# Patient Record
Sex: Female | Born: 1955 | Race: White | Hispanic: No | Marital: Married | Smoking: Never smoker
Health system: Southern US, Community
[De-identification: ages and names within clinical notes are randomized; demographics above are authoritative.]

## PROBLEM LIST (undated history)

## (undated) DIAGNOSIS — G43909 Migraine, unspecified, not intractable, without status migrainosus: Secondary | ICD-10-CM

## (undated) HISTORY — PX: EYE SURGERY: SHX253

## (undated) HISTORY — DX: Migraine, unspecified, not intractable, without status migrainosus: G43.909

---

## 1988-03-20 HISTORY — PX: BREAST BIOPSY: SHX20

## 1997-07-27 ENCOUNTER — Other Ambulatory Visit: Admission: RE | Admit: 1997-07-27 | Discharge: 1997-07-27 | Payer: Self-pay | Admitting: Gynecology

## 1999-02-15 ENCOUNTER — Other Ambulatory Visit: Admission: RE | Admit: 1999-02-15 | Discharge: 1999-02-15 | Payer: Self-pay | Admitting: Gynecology

## 2000-09-27 ENCOUNTER — Other Ambulatory Visit: Admission: RE | Admit: 2000-09-27 | Discharge: 2000-09-27 | Payer: Self-pay | Admitting: Gynecology

## 2000-12-31 ENCOUNTER — Encounter: Payer: Self-pay | Admitting: Internal Medicine

## 2000-12-31 ENCOUNTER — Ambulatory Visit (HOSPITAL_COMMUNITY): Admission: RE | Admit: 2000-12-31 | Discharge: 2000-12-31 | Payer: Self-pay | Admitting: Internal Medicine

## 2002-02-11 ENCOUNTER — Other Ambulatory Visit: Admission: RE | Admit: 2002-02-11 | Discharge: 2002-02-11 | Payer: Self-pay | Admitting: Gynecology

## 2003-02-02 ENCOUNTER — Other Ambulatory Visit: Admission: RE | Admit: 2003-02-02 | Discharge: 2003-02-02 | Payer: Self-pay | Admitting: Gynecology

## 2004-02-09 ENCOUNTER — Other Ambulatory Visit: Admission: RE | Admit: 2004-02-09 | Discharge: 2004-02-09 | Payer: Self-pay | Admitting: Gynecology

## 2005-01-13 ENCOUNTER — Other Ambulatory Visit: Admission: RE | Admit: 2005-01-13 | Discharge: 2005-01-13 | Payer: Self-pay | Admitting: Gynecology

## 2020-08-17 DIAGNOSIS — H2511 Age-related nuclear cataract, right eye: Secondary | ICD-10-CM | POA: Diagnosis not present

## 2020-08-25 DIAGNOSIS — H52201 Unspecified astigmatism, right eye: Secondary | ICD-10-CM | POA: Diagnosis not present

## 2020-08-25 DIAGNOSIS — H2511 Age-related nuclear cataract, right eye: Secondary | ICD-10-CM | POA: Diagnosis not present

## 2020-09-15 DIAGNOSIS — H2512 Age-related nuclear cataract, left eye: Secondary | ICD-10-CM | POA: Diagnosis not present

## 2020-12-17 ENCOUNTER — Telehealth: Payer: Self-pay

## 2020-12-17 NOTE — Telephone Encounter (Signed)
Hepatitis C was returned as non-reactive resulted through [facility].

## 2021-01-06 NOTE — Progress Notes (Signed)
NEW PATIENT- ESTABLISH PATIENT CARE  Assessment and Plan:  Jacqueline Carson was seen today for establish care.  Diagnoses and all orders for this visit:  Encounter for general adult medical examination with abnormal findings Due annually  Screening for diabetes mellitus/abnormal glucose -     Hemoglobin A1c  Screening, anemia, deficiency, iron -     CBC with Differential/Platelet  Screening for thyroid disorder -     TSH  Screening for hematuria or proteinuria -     Urinalysis, Routine w reflex microscopic -     Microalbumin / creatinine urine ratio  Screening for lipid disorders/elevated cholesterol -     Lipid panel  Screening for ischemic heart disease -     EKG 12-Lead  Vitamin D deficiency -     VITAMIN D 25 Hydroxy (Vit-D Deficiency, Fractures)  Screening for endocrine, metabolic and immunity disorder -     COMPLETE METABOLIC PANEL WITH GFR -     Magnesium  Screening for colon cancer -     Cologuard  Screening mammogram, encounter for -     MM DIAG BREAST TOMO BILATERAL; Future     Continue diet and meds as discussed. Further disposition pending results of labs. Discussed med's effects and SE's.   Over 30 minutes of exam, counseling, chart review, and critical decision making was performed.   No future appointments.   ----------------------------------------------------------------------------------------------------------------------  HPI 65 y.o. female  presents for New patient complete physical  Pt has not seen a physician in 18 years. She has a lot of anxiety but does do behavioral modifications- deep breathing to help relax. Has not had mammogram or Pap smear until 2000. Had colonoscopy over 15 years ago   She does have a lot of headaches and has a history of migraines but states daily headaches are mild and resolve once she takes her fish oil. Denies visual changes, nausea, sensitivity to noise and light.   She does take Claritin for seasonal  allergies and it does control symptoms  BMI is Body mass index is 25.3 kg/m., she has been working on diet and exercise. Wt Readings from Last 3 Encounters:  01/11/21 153 lb 3.2 oz (69.5 kg)    Her blood pressure has been controlled at home BP's running 120-130's/ 80-low 90's, today their BP is BP: 140/90 very nervous to have visit today.  She does workout. She denies chest pain, shortness of breath, dizziness.   She is not on cholesterol medication The cholesterol last visit was:  No results found for: CHOL, HDL, LDLCALC, LDLDIRECT, TRIG, CHOLHDL  Last A1C in the office was: No results found for: HGBA1C  Patient is on Vitamin D supplement 2000 units daily   No results found for: VD25OH      Current Medications:  Current Outpatient Medications on File Prior to Visit  Medication Sig   ascorbic acid (QC VITAMIN C WITH ROSE HIPS) 500 MG tablet Take 500 mg by mouth daily.   ASHWAGANDHA PO Take 800 mg by mouth.   Calcium Carbonate (CALCIUM 600 PO) Take by mouth.   Cholecalciferol (D3) 50 MCG (2000 UT) TABS Take by mouth.   Chromium-Cinnamon (CINNAMON PLUS CHROMIUM) [phone] MCG-MG CAPS Take 1,000 mg by mouth.   Cranberry-Vitamin C-Vitamin E (CRANBERRY PLUS VITAMIN C PO) Take by mouth. Cranberry 200mg    Vitamin C 500mg    loratadine (CLARITIN) 10 MG tablet Take 10 mg by mouth daily.   Magnesium 250 MG TABS Take by mouth.   Misc Natural Products (GLUCOSAMINE CHOND  CMP TRIPLE PO) Take by mouth.   Omega-3 Fatty Acids (FISH OIL) 600 MG CAPS Take 600 mg by mouth. EPA 400 DHA   Potassium 99 MG TABS Take by mouth.   TURMERIC CURCUMIN PO Take 500 mg by mouth.   No current facility-administered medications on file prior to visit.     Allergies: Not on File   Medical History:  Past Medical History:  Diagnosis Date   Migraines    Family history- Reviewed and unchanged Social history- Reviewed and unchanged  Immunizations: [employer] 06/12/19, 07/04/19, 02/10/20 Does not get flu  vaccine Hepatits C negative- done 12/06/20    Review of Systems:  Review of Systems  Constitutional:  Negative for chills, fever and malaise/fatigue.  HENT:  Positive for ear pain. Negative for congestion, nosebleeds, sinus pain, sore throat and tinnitus.   Eyes:  Negative for blurred vision and double vision.  Respiratory:  Negative for cough, hemoptysis, sputum production, shortness of breath and wheezing.   Cardiovascular:  Negative for chest pain, palpitations and leg swelling.  Gastrointestinal:  Negative for abdominal pain, constipation, diarrhea, heartburn, nausea and vomiting.  Genitourinary:  Negative for dysuria and frequency.  Musculoskeletal:  Positive for back pain. Negative for joint pain.       Left leg cramps  Skin:  Negative for rash.       2 moles on back  Neurological:  Positive for headaches. Negative for tingling, tremors, sensory change and weakness.  Psychiatric/Behavioral:  Positive for depression. The patient is nervous/anxious. The patient does not have insomnia.      Physical Exam: BP 140/90   Pulse 82   Temp (!) 97.5 F (36.4 C)   Ht 5' 5.25" (1.657 m)   Wt 153 lb 3.2 oz (69.5 kg)   SpO2 96%   BMI 25.30 kg/m  Wt Readings from Last 3 Encounters:  01/11/21 153 lb 3.2 oz (69.5 kg)   General Appearance: Well nourished, in no apparent distress. Eyes: PERRLA, EOMs, conjunctiva no swelling or erythema Sinuses: No Frontal/maxillary tenderness ENT/Mouth: Ext aud canals clear, TMs without erythema, bulging. No erythema, swelling, or exudate on post pharynx.  Tonsils not swollen or erythematous. Hearing normal.  Neck: Supple, thyroid normal.  Respiratory: Respiratory effort normal, BS equal bilaterally without rales, rhonchi, wheezing or stridor.  Cardio: RRR with no MRGs. Brisk peripheral pulses without edema.  Abdomen: Soft, + BS.  Non tender, no guarding, rebound, hernias, masses. Lymphatics: Non tender without lymphadenopathy.  Musculoskeletal: Full  ROM, 5/5 strength, Normal gait Skin: Warm, dry without rashes, lesions, ecchymosis.  Neuro: Cranial nerves intact. No cerebellar symptoms.  Psych: Awake and oriented X 3, normal affect, Insight and Judgment appropriate.    [name], NP 11:06 AM [facility]

## 2021-01-11 ENCOUNTER — Other Ambulatory Visit: Payer: Self-pay

## 2021-01-11 ENCOUNTER — Encounter: Payer: Self-pay | Admitting: Nurse Practitioner

## 2021-01-11 ENCOUNTER — Ambulatory Visit (INDEPENDENT_AMBULATORY_CARE_PROVIDER_SITE_OTHER): Payer: Medicare Other | Admitting: Nurse Practitioner

## 2021-01-11 VITALS — BP 140/90 | HR 82 | Temp 97.5°F | Ht 65.25 in | Wt 153.2 lb

## 2021-01-11 DIAGNOSIS — Z136 Encounter for screening for cardiovascular disorders: Secondary | ICD-10-CM

## 2021-01-11 DIAGNOSIS — E559 Vitamin D deficiency, unspecified: Secondary | ICD-10-CM | POA: Diagnosis not present

## 2021-01-11 DIAGNOSIS — Z131 Encounter for screening for diabetes mellitus: Secondary | ICD-10-CM

## 2021-01-11 DIAGNOSIS — E7800 Pure hypercholesterolemia, unspecified: Secondary | ICD-10-CM | POA: Diagnosis not present

## 2021-01-11 DIAGNOSIS — Z1329 Encounter for screening for other suspected endocrine disorder: Secondary | ICD-10-CM | POA: Diagnosis not present

## 2021-01-11 DIAGNOSIS — I10 Essential (primary) hypertension: Secondary | ICD-10-CM

## 2021-01-11 DIAGNOSIS — Z0000 Encounter for general adult medical examination without abnormal findings: Secondary | ICD-10-CM

## 2021-01-11 DIAGNOSIS — J302 Other seasonal allergic rhinitis: Secondary | ICD-10-CM

## 2021-01-11 DIAGNOSIS — R7309 Other abnormal glucose: Secondary | ICD-10-CM

## 2021-01-11 DIAGNOSIS — Z130 Encounter for screening for diseases of the blood and blood-forming organs and certain disorders involving the immune mechanism: Secondary | ICD-10-CM

## 2021-01-11 DIAGNOSIS — Z1389 Encounter for screening for other disorder: Secondary | ICD-10-CM

## 2021-01-11 DIAGNOSIS — Z0001 Encounter for general adult medical examination with abnormal findings: Secondary | ICD-10-CM

## 2021-01-11 DIAGNOSIS — Z1211 Encounter for screening for malignant neoplasm of colon: Secondary | ICD-10-CM

## 2021-01-11 DIAGNOSIS — Z13220 Encounter for screening for lipoid disorders: Secondary | ICD-10-CM

## 2021-01-11 DIAGNOSIS — Z1231 Encounter for screening mammogram for malignant neoplasm of breast: Secondary | ICD-10-CM

## 2021-01-11 NOTE — Patient Instructions (Signed)

## 2021-01-12 LAB — CBC WITH DIFFERENTIAL/PLATELET
Absolute Monocytes: 370 cells/uL (ref 200–950)
Basophils Absolute: 38 cells/uL (ref 0–200)
Basophils Relative: 0.8 %
Eosinophils Absolute: 29 cells/uL (ref 15–500)
Eosinophils Relative: 0.6 %
HCT: 42.8 % (ref 35.0–45.0)
Hemoglobin: 14.4 g/dL (ref 11.7–15.5)
Lymphs Abs: 1819 cells/uL (ref 850–3900)
MCH: 29.2 pg (ref 27.0–33.0)
MCHC: 33.6 g/dL (ref 32.0–36.0)
MCV: 86.8 fL (ref 80.0–100.0)
MPV: 9.2 fL (ref 7.5–12.5)
Monocytes Relative: 7.7 %
Neutro Abs: 2544 cells/uL (ref 1500–7800)
Neutrophils Relative %: 53 %
Platelets: 378 10*3/uL (ref 140–400)
RBC: 4.93 10*6/uL (ref 3.80–5.10)
RDW: 13 % (ref 11.0–15.0)
Total Lymphocyte: 37.9 %
WBC: 4.8 10*3/uL (ref 3.8–10.8)

## 2021-01-12 LAB — URINALYSIS, ROUTINE W REFLEX MICROSCOPIC
Bacteria, UA: NONE SEEN /HPF
Bilirubin Urine: NEGATIVE
Glucose, UA: NEGATIVE
Hgb urine dipstick: NEGATIVE
Hyaline Cast: NONE SEEN /LPF
Ketones, ur: NEGATIVE
Nitrite: NEGATIVE
Protein, ur: NEGATIVE
RBC / HPF: NONE SEEN /HPF (ref 0–2)
Specific Gravity, Urine: 1.008 (ref 1.001–1.035)
Squamous Epithelial / HPF: NONE SEEN /HPF (ref <=5)
WBC, UA: NONE SEEN /HPF (ref 0–5)
pH: 7 (ref 5.0–8.0)

## 2021-01-12 LAB — MICROALBUMIN / CREATININE URINE RATIO
Creatinine, Urine: 42 mg/dL (ref 20–275)
Microalb, Ur: <0.2 mg/dL

## 2021-01-12 LAB — COMPLETE METABOLIC PANEL WITH GFR
AG Ratio: 1.5 (calc) (ref 1.0–2.5)
ALT: 12 U/L (ref 6–29)
AST: 13 U/L (ref 10–35)
Albumin: 4.4 g/dL (ref 3.6–5.1)
Alkaline phosphatase (APISO): 57 U/L (ref 37–153)
BUN: 11 mg/dL (ref 7–25)
CO2: 27 mmol/L (ref 20–32)
Calcium: 9.7 mg/dL (ref 8.6–10.4)
Chloride: 104 mmol/L (ref 98–110)
Creat: 0.69 mg/dL (ref 0.50–1.05)
Globulin: 2.9 g/dL (calc) (ref 1.9–3.7)
Glucose, Bld: 101 mg/dL — ABNORMAL HIGH (ref 65–99)
Potassium: 4.8 mmol/L (ref 3.5–5.3)
Sodium: 139 mmol/L (ref 135–146)
Total Bilirubin: 0.5 mg/dL (ref 0.2–1.2)
Total Protein: 7.3 g/dL (ref 6.1–8.1)
eGFR: 96 mL/min/{1.73_m2} (ref >=60)

## 2021-01-12 LAB — LIPID PANEL
Cholesterol: 259 mg/dL — ABNORMAL HIGH (ref <200)
HDL: 76 mg/dL (ref >=50)
LDL Cholesterol (Calc): 157 mg/dL (calc) — ABNORMAL HIGH
Non-HDL Cholesterol (Calc): 183 mg/dL (calc) — ABNORMAL HIGH (ref <130)
Total CHOL/HDL Ratio: 3.4 (calc) (ref <5.0)
Triglycerides: 139 mg/dL (ref <150)

## 2021-01-12 LAB — HEMOGLOBIN A1C
Hgb A1c MFr Bld: 6 % of total Hgb — ABNORMAL HIGH (ref <5.7)
Mean Plasma Glucose: 126 mg/dL
eAG (mmol/L): 7 mmol/L

## 2021-01-12 LAB — TSH: TSH: 0.67 mIU/L (ref 0.40–4.50)

## 2021-01-12 LAB — MAGNESIUM: Magnesium: 2.3 mg/dL (ref 1.5–2.5)

## 2021-01-12 LAB — VITAMIN D 25 HYDROXY (VIT D DEFICIENCY, FRACTURES): Vit D, 25-Hydroxy: 27 ng/mL — ABNORMAL LOW (ref 30–100)

## 2021-01-12 LAB — MICROSCOPIC MESSAGE

## 2021-01-20 DIAGNOSIS — Z1211 Encounter for screening for malignant neoplasm of colon: Secondary | ICD-10-CM | POA: Diagnosis not present

## 2021-01-21 ENCOUNTER — Other Ambulatory Visit: Payer: Self-pay | Admitting: Nurse Practitioner

## 2021-01-21 DIAGNOSIS — Z1231 Encounter for screening mammogram for malignant neoplasm of breast: Secondary | ICD-10-CM

## 2021-01-27 LAB — COLOGUARD: COLOGUARD: NEGATIVE

## 2021-02-04 ENCOUNTER — Ambulatory Visit
Admission: RE | Admit: 2021-02-04 | Discharge: 2021-02-04 | Disposition: A | Discharge status: Home / Self Care | Payer: Medicare Other | Source: Ambulatory Visit | Attending: Nurse Practitioner | Admitting: Nurse Practitioner

## 2021-02-04 ENCOUNTER — Other Ambulatory Visit: Payer: Self-pay

## 2021-02-04 DIAGNOSIS — Z1231 Encounter for screening mammogram for malignant neoplasm of breast: Secondary | ICD-10-CM | POA: Diagnosis not present

## 2021-06-08 ENCOUNTER — Ambulatory Visit (INDEPENDENT_AMBULATORY_CARE_PROVIDER_SITE_OTHER): Payer: Medicare Other | Admitting: Nurse Practitioner

## 2021-06-08 ENCOUNTER — Other Ambulatory Visit: Payer: Self-pay

## 2021-06-08 ENCOUNTER — Encounter: Payer: Self-pay | Admitting: Nurse Practitioner

## 2021-06-08 VITALS — BP 130/88 | HR 82 | Temp 97.9°F | Wt 151.4 lb

## 2021-06-08 DIAGNOSIS — J0110 Acute frontal sinusitis, unspecified: Secondary | ICD-10-CM | POA: Diagnosis not present

## 2021-06-08 DIAGNOSIS — R7309 Other abnormal glucose: Secondary | ICD-10-CM | POA: Insufficient documentation

## 2021-06-08 DIAGNOSIS — E663 Overweight: Secondary | ICD-10-CM

## 2021-06-08 DIAGNOSIS — E782 Mixed hyperlipidemia: Secondary | ICD-10-CM | POA: Insufficient documentation

## 2021-06-08 DIAGNOSIS — E559 Vitamin D deficiency, unspecified: Secondary | ICD-10-CM | POA: Insufficient documentation

## 2021-06-08 MED ORDER — AZITHROMYCIN 250 MG PO TABS: ORAL_TABLET | ORAL | 1 refills | Status: DC

## 2021-06-08 MED ORDER — DEXAMETHASONE 1 MG PO TABS: ORAL_TABLET | ORAL | 0 refills | Status: DC

## 2021-06-08 NOTE — Progress Notes (Signed)
Assessment and Plan: ? ?Jacqueline Carson was seen today for acute visit. ? ?Diagnoses and all orders for this visit: ? ? ?Overweight ?Long discussion about weight loss, diet, and exercise ?Recommended diet heavy in fruits and veggies and low in animal meats, cheeses, and dairy products, appropriate calorie intake ?Follow up at next visit ? ?Acute frontal sinusitis, recurrence not specified ?-     azithromycin (ZITHROMAX) 250 MG tablet; Take 2 tablets (500 mg) on  Day 1,  followed by 1 tablet (250 mg) once daily on Days 2 through 5. ?-     dexamethasone (DECADRON) 1 MG tablet; Take 3 tabs for 3 days, 2 tabs for 3 days 1 tab for 5 days. Take with food. ?Continue to push fluids ?Continue fexofenadine daily and Mucinex as needed ?  ? ? ? ?Further disposition pending results of labs. Discussed med's effects and SE's.   ?Over 30 minutes of exam, counseling, chart review, and critical decision making was performed.  ? ?Future Appointments  ?Date Time Provider Department Center  ?07/12/2021 10:30 AM Lucky Cowboy, MD [facility] None  ?01/11/2022  9:00 AM [name], NP [facility] None  ? ? ?------------------------------------------------------------------------------------------------------------------ ? ? ?HPI ?BP 130/88   Pulse 82   Temp 97.9 ?F (36.6 ?C)   Wt 151 lb 6.4 oz (68.7 kg)   SpO2 95%   BMI 25.00 kg/m?  ? ?67 y.o.female presents for bilateral ear pain which has been occurring intermittently x several weeks.  She describes as an ache. Has been using daytime cold pills and it has helped a little.  ?She has always had issues with allergies- she had been on Claritin but recently switched to fexofenadine. ? ?She does note a pain left temple which is aching in nature. Stares in frontal region of head and goes to left temple. ? ?BMI is Body mass index is 25 kg/m?., she has been working on diet and exercise. ?Wt Readings from Last 3 Encounters:  ?06/08/21 151 lb 6.4 oz (68.7 kg)  ?01/11/21 153 lb 3.2 oz (69.5 kg)   ? ? ? ?Blood pressure is well controlled at home without medication. ?BP Readings from Last 3 Encounters:  ?06/08/21 130/88  ?01/11/21 140/90  ?  ? ?Past Medical History:  ?Diagnosis Date  ? Migraines   ?  ? ?Not on File ? ?Current Outpatient Medications on File Prior to Visit  ?Medication Sig  ? ascorbic acid (VITAMIN C) 500 MG tablet Take 500 mg by mouth daily.  ? ASHWAGANDHA PO Take 800 mg by mouth.  ? Calcium Carbonate (CALCIUM 600 PO) Take by mouth.  ? Cholecalciferol (D3) 50 MCG (2000 UT) TABS Take by mouth.  ? Cranberry-Vitamin C-Vitamin E (CRANBERRY PLUS VITAMIN C PO) Take by mouth. Cranberry 200mg    ?Vitamin C 500mg   ? fexofenadine (KP FEXOFENADINE HCL) 180 MG tablet Take 180 mg by mouth daily.  ? Magnesium 250 MG TABS Take by mouth.  ? Misc Natural Products (GLUCOSAMINE CHOND CMP TRIPLE PO) Take by mouth.  ? Omega-3 Fatty Acids (FISH OIL) 600 MG CAPS Take 600 mg by mouth. EPA 400 ?DHA  ? Potassium 99 MG TABS Take by mouth.  ? TURMERIC CURCUMIN PO Take 500 mg by mouth.  ? Chromium-Cinnamon (CINNAMON PLUS CHROMIUM) [id number] MCG-MG CAPS Take 1,000 mg by mouth. (Patient not taking: Reported on 06/08/2021)  ? loratadine (CLARITIN) 10 MG tablet Take 10 mg by mouth daily. (Patient not taking: Reported on 06/08/2021)  ? ?No current facility-administered medications on file prior to visit.  ? ? ?  ROS: all negative except above.  ? ?Physical Exam: ? ?BP 130/88   Pulse 82   Temp 97.9 ?F (36.6 ?C)   Wt 151 lb 6.4 oz (68.7 kg)   SpO2 95%   BMI 25.00 kg/m?  ? ?General Appearance: Well nourished, in no apparent distress. ?Eyes: PERRLA, EOMs, conjunctiva no swelling or erythema ?Sinuses: Positive frontal tenderness ?ENT/Mouth: Ext aud canals clear, TMs fluid noted, slight erythema, no drainage.  No erythema, swelling, or exudate on post pharynx.  Tonsils not swollen or erythematous. Hearing normal.  ?Neck: Supple, thyroid normal.  ?Respiratory: Respiratory effort normal, BS equal bilaterally without rales, rhonchi,  wheezing or stridor.  ?Cardio: RRR with no MRGs. Brisk peripheral pulses without edema.  ?Abdomen: Soft, + BS.  Non tender, no guarding, rebound, hernias, masses. ?Lymphatics: + cervical adenopathy bilaterally ?Musculoskeletal: Full ROM, 5/5 strength, normal gait.  ?Skin: Warm, dry without rashes, lesions, ecchymosis.  ?Neuro: Cranial nerves intact. Normal muscle tone, no cerebellar symptoms. Sensation intact.  ?Psych: Awake and oriented X 3, normal affect, Insight and Judgment appropriate.  ?  ? ?[name], NP ?2:03 PM ?[facility] ? ?

## 2021-06-08 NOTE — Patient Instructions (Signed)

## 2021-07-11 NOTE — Patient Instructions (Signed)

## 2021-07-11 NOTE — Progress Notes (Signed)
? ?Future Appointments  ?Date Time Provider Department  ?07/12/2021 10:30 AM Unk Pinto, MD [facility]  ?01/11/2022  9:00 AM Magda Bernheim, NP [facility]  ? ? ?History of Present Illness: ? ? ?    This very nice 66 y.o. MWF  presents for 6 month follow up with HTN, HLD, Pre-Diabetes and Vitamin D Deficiency.  Patient had been lost to medical f/u for 11 years until presenting 6 months ago to re-establish care.  ? ? ?    Patient is followed expectantly for HTN  since Oct 2022  & BP has been controlled at home. Today?s BP is at goal -  126/80. Patient has had no complaints of any cardiac type chest pain, palpitations, dyspnea / orthopnea / PND, dizziness, claudication, or dependent edema. ? ?BP Readings from Last 2 Encounters:  ?06/08/21 130/88  ?01/11/21 140/90  ? ? ? ?    Hyperlipidemia is not controlled with diet . Last Lipids were not at goal : ? ?Lab Results  ?Component Value Date  ? CHOL 259 (H) 01/11/2021  ? HDL 76 01/11/2021  ? LDLCALC 157 (H) 01/11/2021  ? TRIG 139 01/11/2021  ? CHOLHDL 3.4 01/11/2021  ? ? ? ?Also, the patient has history of PreDiabetes and has had no symptoms of reactive hypoglycemia, diabetic polys, paresthesias or visual blurring.  Last A1c was  not at goal : ? ?Lab Results  ?Component Value Date  ? HGBA1C 6.0 (H) 01/11/2021  ? ?    ? ?                                                  Further, the patient also has history of Vitamin D Deficiency ("40" /2012)  and does not supplement Vitamin D. Last Vitamin D was very low : ? ?Lab Results  ?Component Value Date  ? VD25OH 27 (L) 01/11/2021  ? ? ? ?Current Outpatient Medications on File Prior to Visit  ?Medication Sig  ? VITAMIN C 500 MG tablet Take daily  ? ASHWAGANDHA 800 mg  Take daily  ? Calcium Carbonate  600 mg Take daily  ? Cholecalciferol D  2000 u Take daily  ? CRANBERRY 200mg   +VITAMIN C 500mg  Take daily  ? fexofenadine  180 MG tablet Take  daily  ? Magnesium 250 MG TABS Take daily  ? GLUCOSAMINE CHOND CMP TRIPLE  Take daily  ?  Omega-3 FISH OIL 600 MG CAPS Take daily  ? Potassium 99 MG TABS Take daily  ? TURMERIC CURCUMIN  Take daily  ? ? ?PMHx:   ?Past Medical History:  ?Diagnosis Date  ? Migraines   ? ? ? ?Past Surgical History:  ?Procedure Laterality Date  ? BREAST BIOPSY Left 1990  ? biospy of fatty tissue per pt states  ? EYE SURGERY Bilateral   ? catarracts  ? ? ?FHx:    Reviewed / unchanged ? ?SHx:    Reviewed / unchanged  ? ?Systems Review: ? ?Constitutional: Denies fever, chills, wt changes, headaches, insomnia, fatigue, night sweats, change in appetite. ?Eyes: Denies redness, blurred vision, diplopia, discharge, itchy, watery eyes.  ?ENT: Denies discharge, congestion, post nasal drip, epistaxis, sore throat, earache, hearing loss, dental pain, tinnitus, vertigo, sinus pain, snoring.  ?CV: Denies chest pain, palpitations, irregular heartbeat, syncope, dyspnea, diaphoresis, orthopnea, PND, claudication or edema. ?Respiratory: denies cough, dyspnea, DOE, pleurisy,  hoarseness, laryngitis, wheezing.  ?Gastrointestinal: Denies dysphagia, odynophagia, heartburn, reflux, water brash, abdominal pain or cramps, nausea, vomiting, bloating, diarrhea, constipation, hematemesis, melena, hematochezia  or hemorrhoids. ?Genitourinary: Denies dysuria, frequency, urgency, nocturia, hesitancy, discharge, hematuria or flank pain. ?Musculoskeletal: Denies arthralgias, myalgias, stiffness, jt. swelling, pain, limping or strain/sprain.  ?Skin: Denies pruritus, rash, hives, warts, acne, eczema or change in skin lesion(s). ?Neuro: No weakness, tremor, incoordination, spasms, paresthesia or pain. ?Psychiatric: Denies confusion, memory loss or sensory loss. ?Endo: Denies change in weight, skin or hair change.  ?Heme/Lymph: No excessive bleeding, bruising or enlarged lymph nodes. ? ?Physical Exam ? ?BP 126/80   Pulse 90   Temp 97.9 ?F (36.6 ?C)   Resp 16   Ht 5' 5.25" (1.657 m)   Wt 152 lb 12.8 oz (69.3 kg)   SpO2 96%   BMI 25.23 kg/m?  ? ?Appears   well nourished, well groomed  and in no distress. ? ?Eyes: PERRLA, EOMs, conjunctiva no swelling or erythema. ?Sinuses: No frontal/maxillary tenderness ?ENT/Mouth: EAC's clear, TM's nl w/o erythema, bulging. Nares clear w/o erythema, swelling, exudates. Oropharynx clear without erythema or exudates. Oral hygiene is good. Tongue normal, non obstructing. Hearing intact.  ?Neck: Supple. Thyroid not palpable. Car 2+/2+ without bruits, nodes or JVD. ?Chest: Respirations nl with BS clear & equal w/o rales, rhonchi, wheezing or stridor.  ?Cor: Heart sounds normal w/ regular rate and rhythm without sig. murmurs, gallops, clicks or rubs. Peripheral pulses normal and equal  without edema.  ?Abdomen: Soft & bowel sounds normal. Non-tender w/o guarding, rebound, hernias, masses or organomegaly.  ?Lymphatics: Unremarkable.  ?Musculoskeletal: Full ROM all peripheral extremities, joint stability, 5/5 strength and normal gait.  ?Skin: Warm, dry without exposed rashes, lesions or ecchymosis apparent.  ?Neuro: Cranial nerves intact, reflexes equal bilaterally. Sensory-motor testing grossly intact. Tendon reflexes grossly intact.  ?Pysch: Alert & oriented x 3.  Insight and judgement nl & appropriate. No ideations. ? ?Assessment and Plan: ? ?1. Labile hypertension ? ?- Continue  monitor blood pressure at home.  ?- Continue DASH diet.  Reminder to go to the ER if any CP,  ?SOB, nausea, dizziness, severe HA, changes vision/speech. ?   ?- CBC with Differential/Platelet ?- COMPLETE METABOLIC PANEL WITH GFR ?- Magnesium ?- TSH ? ?2. Hyperlipidemia, mixed ? ?- Continue diet/meds, exercise,& lifestyle modifications.  ?- Continue monitor periodic cholesterol/liver & renal functions  ?  ?- Lipid panel ?- TSH ? ?3. Abnormal glucose ? ?- Continue diet, exercise  ?- Lifestyle modifications.  ?- Monitor appropriate labs ?  ?- Hemoglobin A1c ?- Insulin, random ? ?4. Vitamin D deficiency ? ?- Continue supplementation ?  ?- VITAMIN D 25 Hydroxy  ? ?5.  Prediabetes ? ?- Hemoglobin A1c ?- Insulin, random ? ?6. Medication management ? ?- CBC with Differential/Platelet ?- COMPLETE METABOLIC PANEL WITH GFR ?- Magnesium ?- Lipid panel ?- TSH ?- Hemoglobin A1c ?- Insulin, random ?- VITAMIN D 25 Hydroxy  ? ? ?      Discussed  regular exercise, BP monitoring, weight control to achieve/maintain BMI less than 25 and discussed med and SE's. Recommended labs to assess /monitor clinical status .  I discussed the assessment and treatment plan with the patient. The patient was provided an opportunity to ask questions and all were answered. The patient agreed with the plan and demonstrated an understanding of the instructions.  I provided over 30 minutes of exam, counseling, chart review and  complex critical decision making. ? ?      The  patient was advised to call back or seek an in-person evaluation if the symptoms worsen or if the condition fails to improve as anticipated. ? ? ?Kirtland Bouchard, MD ? ?

## 2021-07-12 ENCOUNTER — Ambulatory Visit (INDEPENDENT_AMBULATORY_CARE_PROVIDER_SITE_OTHER): Payer: Medicare Other | Admitting: Internal Medicine

## 2021-07-12 ENCOUNTER — Encounter: Payer: Self-pay | Admitting: Internal Medicine

## 2021-07-12 VITALS — BP 126/80 | HR 90 | Temp 97.9°F | Resp 16 | Ht 65.25 in | Wt 152.8 lb

## 2021-07-12 DIAGNOSIS — E782 Mixed hyperlipidemia: Secondary | ICD-10-CM

## 2021-07-12 DIAGNOSIS — E559 Vitamin D deficiency, unspecified: Secondary | ICD-10-CM | POA: Diagnosis not present

## 2021-07-12 DIAGNOSIS — Z79899 Other long term (current) drug therapy: Secondary | ICD-10-CM | POA: Diagnosis not present

## 2021-07-12 DIAGNOSIS — R0989 Other specified symptoms and signs involving the circulatory and respiratory systems: Secondary | ICD-10-CM | POA: Diagnosis not present

## 2021-07-12 DIAGNOSIS — R7309 Other abnormal glucose: Secondary | ICD-10-CM

## 2021-07-12 DIAGNOSIS — R7303 Prediabetes: Secondary | ICD-10-CM | POA: Diagnosis not present

## 2021-07-13 ENCOUNTER — Other Ambulatory Visit: Payer: Self-pay | Admitting: Internal Medicine

## 2021-07-13 LAB — CBC WITH DIFFERENTIAL/PLATELET
Absolute Monocytes: 302 cells/uL (ref 200–950)
Basophils Absolute: 41 cells/uL (ref 0–200)
Basophils Relative: 0.9 %
Eosinophils Absolute: 50 cells/uL (ref 15–500)
Eosinophils Relative: 1.1 %
HCT: 44.3 % (ref 35.0–45.0)
Hemoglobin: 14.2 g/dL (ref 11.7–15.5)
Lymphs Abs: 1679 cells/uL (ref 850–3900)
MCH: 27.4 pg (ref 27.0–33.0)
MCHC: 32.1 g/dL (ref 32.0–36.0)
MCV: 85.4 fL (ref 80.0–100.0)
MPV: 9 fL (ref 7.5–12.5)
Monocytes Relative: 6.7 %
Neutro Abs: 2430 cells/uL (ref 1500–7800)
Neutrophils Relative %: 54 %
Platelets: 442 10*3/uL — ABNORMAL HIGH (ref 140–400)
RBC: 5.19 10*6/uL — ABNORMAL HIGH (ref 3.80–5.10)
RDW: 14.7 % (ref 11.0–15.0)
Total Lymphocyte: 37.3 %
WBC: 4.5 10*3/uL (ref 3.8–10.8)

## 2021-07-13 LAB — COMPLETE METABOLIC PANEL WITH GFR
AG Ratio: 1.6 (calc) (ref 1.0–2.5)
ALT: 16 U/L (ref 6–29)
AST: 15 U/L (ref 10–35)
Albumin: 4.4 g/dL (ref 3.6–5.1)
Alkaline phosphatase (APISO): 58 U/L (ref 37–153)
BUN: 8 mg/dL (ref 7–25)
CO2: 27 mmol/L (ref 20–32)
Calcium: 9.9 mg/dL (ref 8.6–10.4)
Chloride: 104 mmol/L (ref 98–110)
Creat: 0.76 mg/dL (ref 0.50–1.05)
Globulin: 2.8 g/dL (calc) (ref 1.9–3.7)
Glucose, Bld: 148 mg/dL — ABNORMAL HIGH (ref 65–99)
Potassium: 4.8 mmol/L (ref 3.5–5.3)
Sodium: 140 mmol/L (ref 135–146)
Total Bilirubin: 0.5 mg/dL (ref 0.2–1.2)
Total Protein: 7.2 g/dL (ref 6.1–8.1)
eGFR: 86 mL/min/{1.73_m2} (ref >=60)

## 2021-07-13 LAB — MAGNESIUM: Magnesium: 2.3 mg/dL (ref 1.5–2.5)

## 2021-07-13 LAB — LIPID PANEL
Cholesterol: 269 mg/dL — ABNORMAL HIGH (ref <200)
HDL: 80 mg/dL (ref >=50)
LDL Cholesterol (Calc): 165 mg/dL (calc) — ABNORMAL HIGH
Non-HDL Cholesterol (Calc): 189 mg/dL (calc) — ABNORMAL HIGH (ref <130)
Total CHOL/HDL Ratio: 3.4 (calc) (ref <5.0)
Triglycerides: 120 mg/dL (ref <150)

## 2021-07-13 LAB — HEMOGLOBIN A1C
Hgb A1c MFr Bld: 6.2 % of total Hgb — ABNORMAL HIGH (ref <5.7)
Mean Plasma Glucose: 131 mg/dL
eAG (mmol/L): 7.3 mmol/L

## 2021-07-13 LAB — VITAMIN D 25 HYDROXY (VIT D DEFICIENCY, FRACTURES): Vit D, 25-Hydroxy: 45 ng/mL (ref 30–100)

## 2021-07-13 LAB — TSH: TSH: 0.61 mIU/L (ref 0.40–4.50)

## 2021-07-13 LAB — INSULIN, RANDOM: Insulin: 47.8 u[IU]/mL — ABNORMAL HIGH

## 2021-07-13 NOTE — Progress Notes (Signed)
<><><><><><><><><><><><><><><><><><><><><><><><><><><><><><><><><> ?<><><><><><><><><><><><><><><><><><><><><><><><><><><><><><><><><> ?- Test results slightly outside the reference range are not unusual. ?If there is anything important, I will review this with you,  ?otherwise it is considered normal test values.  ?If you have further questions,  ?please do not hesitate to contact me at the office or via My Chart.  ?<><><><><><><><><><><><><><><><><><><><><><><><><><><><><><><><><> ? ? ?- Total Chol = ?269 ? is very high risk for Heart Attack /Stroke /Vascular Dementia ??? ? ? ? ( ?Ideal or Goal is less than 180 ?! ?)  ?& ?- Bad /Dangerous LDL Chol =  189 - - >>  Horrible,     Sitting on a time Bomb ! ??? ? ? ? ?( ?Ideal or Goal is less than 70 ?! ?)  ?? ?- Treating with meds to lower Cholesterol is treating the result  ??? ? ?? ? ? ? ? ? ? ? ? ? ? ? ? ? ? ? ? ? ? ? ? ? ? ? ? ? ? ? ? ? ? ? ? ? ? ? ?  & NOT ?treating the cause ? ? ?- The cause is Bad Diet ! ? ? ?? ?- Read or listen to ? ? ?Dr Glenard Haring 's book ? ? ? " How Not to Die ! "  ?? ?- Recommend ?a stricter plant based low cholesterol diet  ? ?- Cholesterol only comes from animal sources  ?- ie. meat, dairy, egg yolks ? ?- Eat all the vegetables you want. ? ?- Avoid meat,  Avoid meat,  Avoid meat -   especially red meat - Beef AND Pork  ? ?- Avoid cheese & dairy - milk & ice cream. ?  ? ?- Cheese is the most concentrated form of trans-fats which  ?                                                              is the worst thing to clog up our arteries.  ? ?- Veggie cheese is OK which can be found in the fresh  ?                           produce section at [employer] or [employer] or [employer] ?============================================================ ?============================================================  ?- As a last resort, you can take medicines if you refuse to improve your diet   ?============================================================ ?============================================================ ? ?- If you are agreeable to start meds , ?                                        please let me know where to send in a prescription ?<><><><><><><><><><><><><><><><><><><><><><><><><><><><><><><><><><> ?<><><><><><><><><><><><><><><><><><><><><><><><><><><><><><><><><><> ? ?- A1c = 6.2%    creeping slowly up the ladder   ?                                    - getting closer & closer to needing to start meds for Diabetes ? ?<><><><><><><><><><><><><><><><><><><><><><><><><><><><><><><><><> ?<><><><><><><><><><><><><><><><><><><><><><><><><><><><><><><><><> ? ?- Insulin = 47.8 shows insulin resistance  Normal insulin level is less than 20  ! ? ?-  Insulin Resistance is a sign of  a sign of early diabetes and  ? ?associated with a 300 % greater risk for ? ?                                            heart attacks,  ?                                                         strokes,  ?                                                               cancer &  ?                                                                   Alzheimer type vascular dementia  ? ?- All this can be cured  and prevented with losing weight  ?         - get Dr Francis Dowse Fuhrman's book 'the End of Diabetes" and "the End of Dieting" ? ? and add many years of good health to your life. ?<><><><><><><><><><><><><><><><><><><><><><><><><><><><><><><><><> ?<><><><><><><><><><><><><><><><><><><><><><><><><><><><><><><><><> ? ?- Vitamin D= 45  ( better  - was  27 ) but still very low  ? ?- - Vitamin D goal is between 70-100.  ? ?- Please INCREASE your Vitamin D up to 6,000 units / day  ? ?- It is very important as a natural anti-inflammatory and helping the  ?immune system protect against viral infections, like the Covid-19  ? ?helping hair, skin, and nails, as well as reducing stroke and  ?heart attack risk.  ? ?- It helps your bones  and helps with mood. ? ?- It also decreases numerous cancer risks so please  ?take it as directed.  ? ?- Low Vit D is associated with a 200-300% higher risk for  ?CANCER  ? ?and 200-300% higher risk for HEART   ATTACK  &  STROKE.   ? ?- It is also associated with higher death rate at younger ages,  ? ?autoimmune diseases like Rheumatoid arthritis, Lupus, Multiple Sclerosis.    ? ?- Also many other serious conditions, like depression, Alzheimer's Dementia,  ? ?infertility, muscle aches, fatigue, fibromyalgia  ? ?- just to name a few. ?<><><><><><><><><><><><><><><><><><><><><><><><><><><><><><><><><> ? ?- All Else - CBC - Kidneys - Electrolytes - Liver - Magnesium & Thyroid   ? ?- all  Normal / OK ?<><><><><><><><><><><><><><><><><><><><><><><><><><><><><><><><><> ?<><><><><><><><><><><><><><><><><><><><><><><><><><><><><><><><><> ? ? ? ? ? ? ?

## 2021-10-11 NOTE — Progress Notes (Deleted)
MEDICARE ANNUAL WELLNESS VISIT AND FOLLOW UP  Assessment:    Over 40 minutes of exam, counseling, chart review and critical decision making was performed Future Appointments  Date Time Provider Department Center  10/12/2021  2:30 PM Raynelle Dick, NP [facility] None  01/11/2022  9:00 AM Raynelle Dick, NP [facility] None     Plan:   During the course of the visit the patient was educated and counseled about appropriate screening and preventive services including:   Pneumococcal vaccine  Prevnar 13 Influenza vaccine Td vaccine Screening electrocardiogram Bone densitometry screening Colorectal cancer screening Diabetes screening Glaucoma screening Nutrition counseling  Advanced directives: requested   Subjective:  Jacqueline Carson is a 66 y.o. female who presents for Medicare Annual Wellness Visit and 3 month follow up.   She has had elevated blood pressure for *** years. Her blood pressure {HAS HAS NOT:18834} been controlled at home, today their BP is   She {DOES_DOES KDT:26712} workout. She denies chest pain, shortness of breath, dizziness.  She {ACTION; IS/IS WPY:09983382} on cholesterol medication and denies myalgias. Her cholesterol {ACTION; IS/IS NOT:21021397} at goal. The cholesterol last visit was:   Lab Results  Component Value Date   CHOL 269 (H) 07/12/2021   HDL 80 07/12/2021   LDLCALC 165 (H) 07/12/2021   TRIG 120 07/12/2021   CHOLHDL 3.4 07/12/2021   She has had diabetes for *** years. She {Has/has not:18111} been working on diet and exercise for ***prediabetes, and denies {Symptoms; diabetes w/o none:19199}. Last A1C in the office was:  Lab Results  Component Value Date   HGBA1C 6.2 (H) 07/12/2021   Last GFR:  No results found for: "GFRNONAA" No results found for: "GFRAA" Patient is on Vitamin D supplement.   Lab Results  Component Value Date   VD25OH 45 07/12/2021      Medication Review: Current Outpatient Medications on File Prior to  Visit  Medication Sig Dispense Refill   ascorbic acid (VITAMIN C) 500 MG tablet Take 500 mg by mouth daily.     ASHWAGANDHA PO Take 800 mg by mouth.     Calcium Carbonate (CALCIUM 600 PO) Take by mouth.     Cholecalciferol (D3) 50 MCG (2000 UT) TABS Take by mouth.     Cranberry-Vitamin C-Vitamin E (CRANBERRY PLUS VITAMIN C PO) Take by mouth. Cranberry 200mg    Vitamin C 500mg      fexofenadine (ALLEGRA) 180 MG tablet Take 180 mg by mouth daily.     Magnesium 250 MG TABS Take by mouth.     Misc Natural Products (GLUCOSAMINE CHOND CMP TRIPLE PO) Take by mouth.     Omega-3 Fatty Acids (FISH OIL) 600 MG CAPS Take 600 mg by mouth. EPA 400 DHA     Potassium 99 MG TABS Take by mouth.     TURMERIC CURCUMIN PO Take 500 mg by mouth.     No current facility-administered medications on file prior to visit.    No Known Allergies  Current Problems (verified) Patient Active Problem List   Diagnosis Date Noted   Mixed hyperlipidemia 06/08/2021   Vitamin D deficiency 06/08/2021   Abnormal glucose 06/08/2021   Overweight (BMI 25.0-29.9) 06/08/2021    Screening Tests  There is no immunization history on file for this patient.  Preventative care: Last colonoscopy: *** Last mammogram: *** Last pap smear/pelvic exam: ***   DEXA:***  Prior vaccinations: TD or Tdap: ***  Influenza: ***  Pneumococcal: *** Prevnar13:  Shingles/Zostavax: **  Names of Other Physician/Practitioners you  currently use: 1. [facility] here for primary care 2. ***, eye doctor, last visit *** 3. ***, dentist, last visit *** Patient Care Team: Lucky Cowboy, MD as PCP - General (Internal Medicine)  SURGICAL HISTORY She  has a past surgical history that includes Eye surgery (Bilateral) and Breast biopsy (Left, 1990). FAMILY HISTORY Her family history includes Breast cancer in her paternal grandmother; Diabetes in her sister; Heart attack in her paternal grandfather;  Hypertension in her sister; Non-Hodgkin's lymphoma in her father; Pulmonary embolism in her mother; Skin cancer in her sister. SOCIAL HISTORY She  reports that she has never smoked. She has never used smokeless tobacco. She reports current alcohol use of about 3.0 standard drinks of alcohol per week. She reports that she does not use drugs.   MEDICARE WELLNESS OBJECTIVES: Physical activity:   Cardiac risk factors:   Depression/mood screen:       No data to display          ADLs:      No data to display           Cognitive Testing  Alert? Yes  Normal Appearance?Yes  Oriented to person? Yes  Place? Yes   Time? Yes  Recall of three objects?  Yes  Can perform simple calculations? Yes  Displays appropriate judgment?Yes  Can read the correct time from a watch face?Yes  EOL planning:    ROS   Objective:     There were no vitals filed for this visit. There is no height or weight on file to calculate BMI.  General appearance: alert, no distress, WD/WN, female HEENT: normocephalic, sclerae anicteric, TMs pearly, nares patent, no discharge or erythema, pharynx normal Oral cavity: MMM, no lesions Neck: supple, no lymphadenopathy, no thyromegaly, no masses Heart: RRR, normal S1, S2, no murmurs Lungs: CTA bilaterally, no wheezes, rhonchi, or rales Abdomen: +bs, soft, non tender, non distended, no masses, no hepatomegaly, no splenomegaly Musculoskeletal: nontender, no swelling, no obvious deformity Extremities: no edema, no cyanosis, no clubbing Pulses: 2+ symmetric, upper and lower extremities, normal cap refill Neurological: alert, oriented x 3, CN2-12 intact, strength normal upper extremities and lower extremities, sensation normal throughout, DTRs 2+ throughout, no cerebellar signs, gait normal Psychiatric: normal affect, behavior normal, pleasant   Medicare Attestation I have personally reviewed: The patient's medical and social history Their use of alcohol, tobacco or  illicit drugs Their current medications and supplements The patient's functional ability including ADLs,fall risks, home safety risks, cognitive, and hearing and visual impairment Diet and physical activities Evidence for depression or mood disorders  The patient's weight, height, BMI, and visual acuity have been recorded in the chart.  I have made referrals, counseling, and provided education to the patient based on review of the above and I have provided the patient with a written personalized care plan for preventive services.     Raynelle Dick, NP   10/11/2021

## 2021-10-12 ENCOUNTER — Ambulatory Visit: Payer: Medicare Other | Admitting: Nurse Practitioner

## 2021-10-12 DIAGNOSIS — R7309 Other abnormal glucose: Secondary | ICD-10-CM

## 2021-10-12 DIAGNOSIS — E663 Overweight: Secondary | ICD-10-CM

## 2021-10-12 DIAGNOSIS — E559 Vitamin D deficiency, unspecified: Secondary | ICD-10-CM

## 2021-10-12 DIAGNOSIS — Z79899 Other long term (current) drug therapy: Secondary | ICD-10-CM

## 2021-10-12 DIAGNOSIS — R0989 Other specified symptoms and signs involving the circulatory and respiratory systems: Secondary | ICD-10-CM

## 2021-10-12 DIAGNOSIS — E782 Mixed hyperlipidemia: Secondary | ICD-10-CM

## 2021-10-12 DIAGNOSIS — Z0000 Encounter for general adult medical examination without abnormal findings: Secondary | ICD-10-CM

## 2021-10-18 NOTE — Progress Notes (Unsigned)
MEDICARE ANNUAL WELLNESS VISIT AND FOLLOW UP  Assessment:    Over 40 minutes of exam, counseling, chart review and critical decision making was performed Future Appointments  Date Time Provider Department Center  10/19/2021  2:30 PM Raynelle Dick, NP [facility] None  01/11/2022  9:00 AM Raynelle Dick, NP [facility] None  07/13/2022  3:00 PM Raynelle Dick, NP [facility] None     Plan:   During the course of the visit the patient was educated and counseled about appropriate screening and preventive services including:   Pneumococcal vaccine  Prevnar 13 Influenza vaccine Td vaccine Screening electrocardiogram Bone densitometry screening Colorectal cancer screening Diabetes screening Glaucoma screening Nutrition counseling  Advanced directives: requested   Subjective:  Jacqueline Carson is a 66 y.o. female who presents for Medicare Annual Wellness Visit and 3 month follow up.   She has had elevated blood pressure for *** years. Her blood pressure {HAS HAS NOT:18834} been controlled at home, today their BP is   She {DOES_DOES TOI:71245} workout. She denies chest pain, shortness of breath, dizziness.  She {ACTION; IS/IS YKD:[id number]} on cholesterol medication and denies myalgias. Her cholesterol {ACTION; IS/IS NOT:21021397} at goal. The cholesterol last visit was:   Lab Results  Component Value Date   CHOL 269 (H) 07/12/2021   HDL 80 07/12/2021   LDLCALC 165 (H) 07/12/2021   TRIG 120 07/12/2021   CHOLHDL 3.4 07/12/2021   She has had diabetes for *** years. She {Has/has not:18111} been working on diet and exercise for ***prediabetes, and denies {Symptoms; diabetes w/o none:19199}. Last A1C in the office was:  Lab Results  Component Value Date   HGBA1C 6.2 (H) 07/12/2021   Last GFR:  No results found for: "GFRNONAA" No results found for: "GFRAA" Patient is on Vitamin D supplement.   Lab Results  Component Value Date   VD25OH 45 07/12/2021      Medication  Review: Current Outpatient Medications on File Prior to Visit  Medication Sig Dispense Refill   ascorbic acid (VITAMIN C) 500 MG tablet Take 500 mg by mouth daily.     ASHWAGANDHA PO Take 800 mg by mouth.     Calcium Carbonate (CALCIUM 600 PO) Take by mouth.     Cholecalciferol (D3) 50 MCG (2000 UT) TABS Take by mouth.     Cranberry-Vitamin C-Vitamin E (CRANBERRY PLUS VITAMIN C PO) Take by mouth. Cranberry 200mg    Vitamin C 500mg      fexofenadine (ALLEGRA) 180 MG tablet Take 180 mg by mouth daily.     Magnesium 250 MG TABS Take by mouth.     Misc Natural Products (GLUCOSAMINE CHOND CMP TRIPLE PO) Take by mouth.     Omega-3 Fatty Acids (FISH OIL) 600 MG CAPS Take 600 mg by mouth. EPA 400 DHA     Potassium 99 MG TABS Take by mouth.     TURMERIC CURCUMIN PO Take 500 mg by mouth.     No current facility-administered medications on file prior to visit.    No Known Allergies  Current Problems (verified) Patient Active Problem List   Diagnosis Date Noted   Mixed hyperlipidemia 06/08/2021   Vitamin D deficiency 06/08/2021   Abnormal glucose 06/08/2021   Overweight (BMI 25.0-29.9) 06/08/2021    Screening Tests  There is no immunization history on file for this patient.  Preventative care: Last colonoscopy: *** Last mammogram: *** Last pap smear/pelvic exam: ***   DEXA:***  Prior vaccinations: TD or Tdap: ***  Influenza: ***  Pneumococcal: ***  Prevnar13:  Shingles/Zostavax: **  Names of Other Physician/Practitioners you currently use: 1. [facility] here for primary care 2. ***, eye doctor, last visit *** 3. ***, dentist, last visit *** Patient Care Team: Lucky Cowboy, MD as PCP - General (Internal Medicine)  SURGICAL HISTORY She  has a past surgical history that includes Eye surgery (Bilateral) and Breast biopsy (Left, 1990). FAMILY HISTORY Her family history includes Breast cancer in her paternal grandmother; Diabetes in her  sister; Heart attack in her paternal grandfather; Hypertension in her sister; Non-Hodgkin's lymphoma in her father; Pulmonary embolism in her mother; Skin cancer in her sister. SOCIAL HISTORY She  reports that she has never smoked. She has never used smokeless tobacco. She reports current alcohol use of about 3.0 standard drinks of alcohol per week. She reports that she does not use drugs.   MEDICARE WELLNESS OBJECTIVES: Physical activity:   Cardiac risk factors:   Depression/mood screen:       No data to display           ADLs:      No data to display            Cognitive Testing  Alert? Yes  Normal Appearance?Yes  Oriented to person? Yes  Place? Yes   Time? Yes  Recall of three objects?  Yes  Can perform simple calculations? Yes  Displays appropriate judgment?Yes  Can read the correct time from a watch face?Yes  EOL planning:    Review of Systems  Constitutional:  Negative for chills, fever and weight loss.  HENT:  Negative for congestion and hearing loss.   Eyes:  Negative for blurred vision and double vision.  Respiratory:  Negative for cough and shortness of breath.   Cardiovascular:  Negative for chest pain, palpitations, orthopnea and leg swelling.  Gastrointestinal:  Negative for abdominal pain, constipation, diarrhea, heartburn, nausea and vomiting.  Musculoskeletal:  Negative for falls, joint pain and myalgias.  Skin:  Negative for rash.  Neurological:  Negative for dizziness, tingling, tremors, loss of consciousness and headaches.  Psychiatric/Behavioral:  Negative for depression, memory loss and suicidal ideas.      Objective:     There were no vitals filed for this visit. There is no height or weight on file to calculate BMI.  General appearance: alert, no distress, WD/WN, female HEENT: normocephalic, sclerae anicteric, TMs pearly, nares patent, no discharge or erythema, pharynx normal Oral cavity: MMM, no lesions Neck: supple, no  lymphadenopathy, no thyromegaly, no masses Heart: RRR, normal S1, S2, no murmurs Lungs: CTA bilaterally, no wheezes, rhonchi, or rales Abdomen: +bs, soft, non tender, non distended, no masses, no hepatomegaly, no splenomegaly Musculoskeletal: nontender, no swelling, no obvious deformity Extremities: no edema, no cyanosis, no clubbing Pulses: 2+ symmetric, upper and lower extremities, normal cap refill Neurological: alert, oriented x 3, CN2-12 intact, strength normal upper extremities and lower extremities, sensation normal throughout, DTRs 2+ throughout, no cerebellar signs, gait normal Psychiatric: normal affect, behavior normal, pleasant   Medicare Attestation I have personally reviewed: The patient's medical and social history Their use of alcohol, tobacco or illicit drugs Their current medications and supplements The patient's functional ability including ADLs,fall risks, home safety risks, cognitive, and hearing and visual impairment Diet and physical activities Evidence for depression or mood disorders  The patient's weight, height, BMI, and visual acuity have been recorded in the chart.  I have made referrals, counseling, and provided education to the patient based on review of the  above and I have provided the patient with a written personalized care plan for preventive services.     Raynelle Dick, NP   10/18/2021

## 2021-10-19 ENCOUNTER — Ambulatory Visit (INDEPENDENT_AMBULATORY_CARE_PROVIDER_SITE_OTHER): Payer: Medicare Other | Admitting: Nurse Practitioner

## 2021-10-19 ENCOUNTER — Encounter: Payer: Self-pay | Admitting: Nurse Practitioner

## 2021-10-19 VITALS — BP 128/72 | HR 88 | Temp 95.5°F | Ht 65.25 in | Wt 151.2 lb

## 2021-10-19 DIAGNOSIS — Z79899 Other long term (current) drug therapy: Secondary | ICD-10-CM | POA: Diagnosis not present

## 2021-10-19 DIAGNOSIS — E663 Overweight: Secondary | ICD-10-CM | POA: Diagnosis not present

## 2021-10-19 DIAGNOSIS — R7309 Other abnormal glucose: Secondary | ICD-10-CM | POA: Diagnosis not present

## 2021-10-19 DIAGNOSIS — R6889 Other general symptoms and signs: Secondary | ICD-10-CM

## 2021-10-19 DIAGNOSIS — E782 Mixed hyperlipidemia: Secondary | ICD-10-CM

## 2021-10-19 DIAGNOSIS — E559 Vitamin D deficiency, unspecified: Secondary | ICD-10-CM

## 2021-10-19 DIAGNOSIS — Z0001 Encounter for general adult medical examination with abnormal findings: Secondary | ICD-10-CM

## 2021-10-19 DIAGNOSIS — R0989 Other specified symptoms and signs involving the circulatory and respiratory systems: Secondary | ICD-10-CM

## 2021-10-19 DIAGNOSIS — Z0000 Encounter for general adult medical examination without abnormal findings: Secondary | ICD-10-CM

## 2021-10-20 LAB — COMPLETE METABOLIC PANEL WITH GFR
AG Ratio: 1.8 (calc) (ref 1.0–2.5)
ALT: 16 U/L (ref 6–29)
AST: 16 U/L (ref 10–35)
Albumin: 4.4 g/dL (ref 3.6–5.1)
Alkaline phosphatase (APISO): 61 U/L (ref 37–153)
BUN: 14 mg/dL (ref 7–25)
CO2: 26 mmol/L (ref 20–32)
Calcium: 9.8 mg/dL (ref 8.6–10.4)
Chloride: 105 mmol/L (ref 98–110)
Creat: 0.84 mg/dL (ref 0.50–1.05)
Globulin: 2.4 g/dL (calc) (ref 1.9–3.7)
Glucose, Bld: 107 mg/dL — ABNORMAL HIGH (ref 65–99)
Potassium: 3.8 mmol/L (ref 3.5–5.3)
Sodium: 140 mmol/L (ref 135–146)
Total Bilirubin: 0.5 mg/dL (ref 0.2–1.2)
Total Protein: 6.8 g/dL (ref 6.1–8.1)
eGFR: 76 mL/min/{1.73_m2} (ref >=60)

## 2021-10-20 LAB — CBC WITH DIFFERENTIAL/PLATELET
Absolute Monocytes: 259 cells/uL (ref 200–950)
Basophils Absolute: 38 cells/uL (ref 0–200)
Basophils Relative: 0.8 %
Eosinophils Absolute: 9 cells/uL — ABNORMAL LOW (ref 15–500)
Eosinophils Relative: 0.2 %
HCT: 38.3 % (ref 35.0–45.0)
Hemoglobin: 12.8 g/dL (ref 11.7–15.5)
Lymphs Abs: 1542 cells/uL (ref 850–3900)
MCH: 28.8 pg (ref 27.0–33.0)
MCHC: 33.4 g/dL (ref 32.0–36.0)
MCV: 86.1 fL (ref 80.0–100.0)
MPV: 9.5 fL (ref 7.5–12.5)
Monocytes Relative: 5.5 %
Neutro Abs: 2853 cells/uL (ref 1500–7800)
Neutrophils Relative %: 60.7 %
Platelets: 314 10*3/uL (ref 140–400)
RBC: 4.45 10*6/uL (ref 3.80–5.10)
RDW: 13.4 % (ref 11.0–15.0)
Total Lymphocyte: 32.8 %
WBC: 4.7 10*3/uL (ref 3.8–10.8)

## 2021-10-20 LAB — LIPID PANEL
Cholesterol: 254 mg/dL — ABNORMAL HIGH (ref <200)
HDL: 78 mg/dL (ref >=50)
LDL Cholesterol (Calc): 148 mg/dL (calc) — ABNORMAL HIGH
Non-HDL Cholesterol (Calc): 176 mg/dL (calc) — ABNORMAL HIGH (ref <130)
Total CHOL/HDL Ratio: 3.3 (calc) (ref <5.0)
Triglycerides: 151 mg/dL — ABNORMAL HIGH (ref <150)

## 2021-10-20 LAB — VITAMIN D 25 HYDROXY (VIT D DEFICIENCY, FRACTURES): Vit D, 25-Hydroxy: 56 ng/mL (ref 30–100)

## 2021-11-22 ENCOUNTER — Telehealth: Payer: Self-pay

## 2021-11-22 NOTE — Patient Outreach (Signed)
  Care Coordination   11/22/2021 Name: Jacqueline Carson MRN: [id number] DOB: 1955-07-08   Care Coordination Outreach Attempts:  An unsuccessful telephone outreach was attempted today to offer the patient information about available care coordination services as a benefit of their health plan.   Follow Up Plan:  Additional outreach attempts will be made to offer the patient care coordination information and services.   Encounter Outcome:  No Answer  Care Coordination Interventions Activated:  No   Care Coordination Interventions:  No, not indicated    Bary Leriche, RN, MSN [facility] Care Management Care Management Coordinator Direct Line [phone] Toll Free: [phone]  Fax: [phone]

## 2021-11-28 ENCOUNTER — Telehealth: Payer: Self-pay

## 2021-11-28 NOTE — Patient Outreach (Signed)
  Care Coordination   11/28/2021 Name: Jacqueline Carson MRN: [id number] DOB: 12-15-1955   Care Coordination Outreach Attempts:  A second unsuccessful outreach was attempted today to offer the patient with information about available care coordination services as a benefit of their health plan.     Follow Up Plan:  Additional outreach attempts will be made to offer the patient care coordination information and services.   Encounter Outcome:  No Answer  Care Coordination Interventions Activated:  No   Care Coordination Interventions:  No, not indicated    Bary Leriche, RN, MSN [facility] Care Management Care Management Coordinator Direct Line [phone] Toll Free: [phone]  Fax: [phone]

## 2021-11-28 NOTE — Patient Outreach (Signed)
  Care Coordination   Initial Visit Note   11/28/2021 Name: Jacqueline Carson MRN: [id number] DOB: 07-29-1955  Jacqueline Carson is a 66 y.o. year old female who sees Lucky Cowboy, MD for primary care. I spoke with  Kyra Searles by phone today.  What matters to the patients health and wellness today?  none    Goals Addressed             This Visit's Progress    COMPLETED: Care Coordination Activities; no follow up required       Care Coordination Interventions: Advised patient to Schedule AWV- patient reports scheduled in October.   Advised on Flu vaccine. Patient does not take.         SDOH assessments and interventions completed:  No      Care Coordination Interventions Activated:  Yes  Care Coordination Interventions:  Yes, provided   Follow up plan: No further intervention required.   Encounter Outcome:  Pt. Visit Completed   Bary Leriche, RN, MSN [facility] Care Management Care Management Coordinator Direct Line [phone] Toll Free: [phone]  Fax: [phone]

## 2022-01-11 ENCOUNTER — Encounter: Payer: Medicare Other | Admitting: Nurse Practitioner

## 2022-01-12 ENCOUNTER — Encounter: Payer: Medicare Other | Admitting: Nurse Practitioner

## 2022-01-25 NOTE — Progress Notes (Unsigned)
COMPLETE PHYSICAL EXAMINATION  Assessment and Plan:  Jacqueline Carson was seen today for establish care.  Diagnoses and all orders for this visit:       Continue diet and meds as discussed. Further disposition pending results of labs. Discussed med's effects and SE's.   Over 30 minutes of exam, counseling, chart review, and critical decision making was performed.   Future Appointments  Date Time Provider Department Center  01/26/2022 10:00 AM Raynelle Dick, NP [facility] None  07/13/2022  3:00 PM Raynelle Dick, NP [facility] None     ----------------------------------------------------------------------------------------------------------------------  HPI 66 y.o. female  presents for New patient complete physical  Pt has not seen a physician in 18 years. She has a lot of anxiety but does do behavioral modifications- deep breathing to help relax. Has not had mammogram or Pap smear until 2000. Had colonoscopy over 15 years ago   She does have a lot of headaches and has a history of migraines but states daily headaches are mild and resolve once she takes her fish oil. Denies visual changes, nausea, sensitivity to noise and light.   She does take Claritin for seasonal allergies and it does control symptoms  BMI is There is no height or weight on file to calculate BMI., she has been working on diet and exercise. Wt Readings from Last 3 Encounters:  10/19/21 151 lb 3.2 oz (68.6 kg)  07/12/21 152 lb 12.8 oz (69.3 kg)  06/08/21 151 lb 6.4 oz (68.7 kg)    Her blood pressure has been controlled at home BP's running 120-130's/ 80-low 90's, today their BP is   very nervous to have visit today.  She does workout. She denies chest pain, shortness of breath, dizziness.   She is not on cholesterol medication The cholesterol last visit was:   Lab Results  Component Value Date   CHOL 254 (H) 10/19/2021   HDL 78 10/19/2021   LDLCALC 148 (H) 10/19/2021   TRIG 151 (H) 10/19/2021    CHOLHDL 3.3 10/19/2021    Last A1C in the office was:  Lab Results  Component Value Date   HGBA1C 6.2 (H) 07/12/2021    Patient is on Vitamin D supplement 2000 units daily   Lab Results  Component Value Date   VD25OH 56 10/19/2021        Current Medications:  Current Outpatient Medications on File Prior to Visit  Medication Sig   Calcium Carbonate (CALCIUM 600 PO) Take by mouth.   Cholecalciferol (D3) 50 MCG (2000 UT) TABS Take by mouth.   fexofenadine (ALLEGRA) 180 MG tablet Take 180 mg by mouth daily.   Magnesium 250 MG TABS Take by mouth.   Misc Natural Products (GLUCOSAMINE CHOND CMP TRIPLE PO) Take by mouth.   Omega-3 Fatty Acids (FISH OIL) 600 MG CAPS Take 600 mg by mouth. EPA 400 DHA   Potassium 99 MG TABS Take by mouth.   TURMERIC CURCUMIN PO Take 500 mg by mouth.   No current facility-administered medications on file prior to visit.     Allergies: No Known Allergies   Medical History:  Past Medical History:  Diagnosis Date   Migraines    Family history- Reviewed and unchanged Social history- Reviewed and unchanged  Immunizations: [employer] 06/12/19, 07/04/19, 02/10/20 Does not get flu vaccine Hepatits C negative- done 12/06/20    Review of Systems:  Review of Systems  Constitutional:  Negative for chills, fever and malaise/fatigue.  HENT:  Positive for ear pain. Negative for congestion, nosebleeds,  sinus pain, sore throat and tinnitus.   Eyes:  Negative for blurred vision and double vision.  Respiratory:  Negative for cough, hemoptysis, sputum production, shortness of breath and wheezing.   Cardiovascular:  Negative for chest pain, palpitations and leg swelling.  Gastrointestinal:  Negative for abdominal pain, constipation, diarrhea, heartburn, nausea and vomiting.  Genitourinary:  Negative for dysuria and frequency.  Musculoskeletal:  Positive for back pain. Negative for joint pain.       Left leg cramps  Skin:  Negative for rash.       2 moles  on back  Neurological:  Positive for headaches. Negative for tingling, tremors, sensory change and weakness.  Psychiatric/Behavioral:  Positive for depression. The patient is nervous/anxious. The patient does not have insomnia.       Physical Exam: There were no vitals taken for this visit. Wt Readings from Last 3 Encounters:  10/19/21 151 lb 3.2 oz (68.6 kg)  07/12/21 152 lb 12.8 oz (69.3 kg)  06/08/21 151 lb 6.4 oz (68.7 kg)   General Appearance: Well nourished, in no apparent distress. Eyes: PERRLA, EOMs, conjunctiva no swelling or erythema Sinuses: No Frontal/maxillary tenderness ENT/Mouth: Ext aud canals clear, TMs without erythema, bulging. No erythema, swelling, or exudate on post pharynx.  Tonsils not swollen or erythematous. Hearing normal.  Neck: Supple, thyroid normal.  Respiratory: Respiratory effort normal, BS equal bilaterally without rales, rhonchi, wheezing or stridor.  Cardio: RRR with no MRGs. Brisk peripheral pulses without edema.  Abdomen: Soft, + BS.  Non tender, no guarding, rebound, hernias, masses. Lymphatics: Non tender without lymphadenopathy.  Musculoskeletal: Full ROM, 5/5 strength, Normal gait Skin: Warm, dry without rashes, lesions, ecchymosis.  Neuro: Cranial nerves intact. No cerebellar symptoms.  Psych: Awake and oriented X 3, normal affect, Insight and Judgment appropriate.    Raynelle Dick, NP 12:20 PM [facility]

## 2022-01-26 ENCOUNTER — Ambulatory Visit (INDEPENDENT_AMBULATORY_CARE_PROVIDER_SITE_OTHER): Payer: Medicare Other | Admitting: Nurse Practitioner

## 2022-01-26 ENCOUNTER — Encounter: Payer: Self-pay | Admitting: Nurse Practitioner

## 2022-01-26 VITALS — BP 112/82 | HR 77 | Temp 97.9°F | Ht 65.0 in | Wt 145.6 lb

## 2022-01-26 DIAGNOSIS — E559 Vitamin D deficiency, unspecified: Secondary | ICD-10-CM | POA: Diagnosis not present

## 2022-01-26 DIAGNOSIS — Z0001 Encounter for general adult medical examination with abnormal findings: Secondary | ICD-10-CM

## 2022-01-26 DIAGNOSIS — E663 Overweight: Secondary | ICD-10-CM

## 2022-01-26 DIAGNOSIS — Z79899 Other long term (current) drug therapy: Secondary | ICD-10-CM | POA: Diagnosis not present

## 2022-01-26 DIAGNOSIS — R7309 Other abnormal glucose: Secondary | ICD-10-CM | POA: Diagnosis not present

## 2022-01-26 DIAGNOSIS — I700 Atherosclerosis of aorta: Secondary | ICD-10-CM

## 2022-01-26 DIAGNOSIS — Z136 Encounter for screening for cardiovascular disorders: Secondary | ICD-10-CM

## 2022-01-26 DIAGNOSIS — Z6824 Body mass index (BMI) 24.0-24.9, adult: Secondary | ICD-10-CM

## 2022-01-26 DIAGNOSIS — E782 Mixed hyperlipidemia: Secondary | ICD-10-CM | POA: Diagnosis not present

## 2022-01-26 DIAGNOSIS — R0989 Other specified symptoms and signs involving the circulatory and respiratory systems: Secondary | ICD-10-CM | POA: Diagnosis not present

## 2022-01-26 DIAGNOSIS — Z0000 Encounter for general adult medical examination without abnormal findings: Secondary | ICD-10-CM

## 2022-01-26 DIAGNOSIS — F419 Anxiety disorder, unspecified: Secondary | ICD-10-CM

## 2022-01-26 DIAGNOSIS — Z1329 Encounter for screening for other suspected endocrine disorder: Secondary | ICD-10-CM

## 2022-01-26 DIAGNOSIS — Z1389 Encounter for screening for other disorder: Secondary | ICD-10-CM

## 2022-01-26 MED ORDER — ESCITALOPRAM OXALATE 10 MG PO TABS: 10.0000 mg | ORAL_TABLET | Freq: Every day | ORAL | 2 refills | Status: DC

## 2022-01-26 NOTE — Patient Instructions (Signed)
Escitalopram 10 mg 1 tab daily, will take 1 month to feel full effects- if develop side effects or mood is not controlled please notify the office  Escitalopram Tablets What is this medication? ESCITALOPRAM (es sye TAL oh pram) treats depression and anxiety. It increases the amount of serotonin in the brain, a hormone that helps regulate mood. It belongs to a group of medications called SSRIs. This medicine may be used for other purposes; ask your health care provider or pharmacist if you have questions. COMMON BRAND NAME(S): Lexapro What should I tell my care team before I take this medication? They need to know if you have any of these conditions: Bipolar disorder or a family history of bipolar disorder Diabetes Glaucoma Heart disease Kidney or liver disease Receiving electroconvulsive therapy Seizures Suicidal thoughts, plans, or attempt by you or a family member An unusual or allergic reaction to escitalopram, the related medication citalopram, other medications, foods, dyes, or preservatives Pregnant or trying to become pregnant Breast-feeding How should I use this medication? Take this medication by mouth with a glass of water. Follow the directions on the prescription label. You can take it with or without food. If it upsets your stomach, take it with food. Take your medication at regular intervals. Do not take it more often than directed. Do not stop taking this medication suddenly except upon the advice of your care team. Stopping this medication too quickly may cause serious side effects or your condition may worsen. A special MedGuide will be given to you by the pharmacist with each prescription and refill. Be sure to read this information carefully each time. Talk to your care team regarding the use of this medication in children. Special care may be needed. Overdosage: If you think you have taken too much of this medicine contact a poison control center or emergency room at  once. NOTE: This medicine is only for you. Do not share this medicine with others. What if I miss a dose? If you miss a dose, take it as soon as you can. If it is almost time for your next dose, take only that dose. Do not take double or extra doses. What may interact with this medication? Do not take this medication with any of the following: Certain medications for fungal infections like fluconazole, itraconazole, ketoconazole, posaconazole, voriconazole Cisapride Citalopram Dronedarone Linezolid MAOIs like Carbex, Eldepryl, Marplan, Nardil, and Parnate Methylene blue (injected into a vein) Pimozide Thioridazine This medication may also interact with the following: Alcohol Amphetamines Aspirin and aspirin-like medications Carbamazepine Certain medications for depression, anxiety, or psychotic disturbances Certain medications for migraine headache like almotriptan, eletriptan, frovatriptan, naratriptan, rizatriptan, sumatriptan, zolmitriptan Certain medications for sleep Certain medications that treat or prevent blood clots like warfarin, enoxaparin, dalteparin Cimetidine Diuretics Dofetilide Fentanyl Furazolidone Isoniazid Lithium Metoprolol NSAIDs, medications for pain and inflammation, like ibuprofen or naproxen Other medications that prolong the QT interval (cause an abnormal heart rhythm) Procarbazine Rasagiline Supplements like St. John's wort, kava kava, valerian Tramadol Tryptophan Ziprasidone This list may not describe all possible interactions. Give your health care provider a list of all the medicines, herbs, non-prescription drugs, or dietary supplements you use. Also tell them if you smoke, drink alcohol, or use illegal drugs. Some items may interact with your medicine. What should I watch for while using this medication? Tell your care team if your symptoms do not get better or if they get worse. Visit your care team for regular checks on your progress.  Because it may take  several weeks to see the full effects of this medication, it is important to continue your treatment as prescribed by your care team. Watch for new or worsening thoughts of suicide or depression. This includes sudden changes in mood, behaviors, or thoughts. These changes can happen at any time but are more common in the beginning of treatment or after a change in dose. Call your care team right away if you experience these thoughts or worsening depression. Manic episodes may happen in patients with bipolar disorder who take this medication. Watch for changes in feelings or behaviors such as feeling anxious, nervous, agitated, panicky, irritable, hostile, aggressive, impulsive, severely restless, overly excited and hyperactive, or trouble sleeping. These symptoms can happen at any time but are more common in the beginning of treatment or after a change in dose. Call your care team right away if you notice any of these symptoms. You may get drowsy or dizzy. Do not drive, use machinery, or do anything that needs mental alertness until you know how this medication affects you. Do not stand or sit up quickly, especially if you are an older patient. This reduces the risk of dizzy or fainting spells. Alcohol may interfere with the effect of this medication. Avoid alcoholic drinks. Your mouth may get dry. Chewing sugarless gum or sucking hard candy, and drinking plenty of water may help. Contact your care team if the problem does not go away or is severe. What side effects may I notice from receiving this medication? Side effects that you should report to your care team as soon as possible: Allergic reactions--skin rash, itching, hives, swelling of the face, lips, tongue, or throat Bleeding--bloody or black, tar-like stools, red or dark brown urine, vomiting blood or brown material that looks like coffee grounds, small, red or purple spots on skin, unusual bleeding or bruising Heart rhythm  changes--fast or irregular heartbeat, dizziness, feeling faint or lightheaded, chest pain, trouble breathing Low sodium level--muscle weakness, fatigue, dizziness, headache, confusion Serotonin syndrome--irritability, confusion, fast or irregular heartbeat, muscle stiffness, twitching muscles, sweating, high fever, seizure, chills, vomiting, diarrhea Sudden eye pain or change in vision such as blurry vision, seeing halos around lights, vision loss Thoughts of suicide or self-harm, worsening mood, feelings of depression Side effects that usually do not require medical attention (report to your care team if they continue or are bothersome): Change in sex drive or performance Diarrhea Excessive sweating Nausea Tremors or shaking Upset stomach This list may not describe all possible side effects. Call your doctor for medical advice about side effects. You may report side effects to FDA at 1-800-FDA-1088. Where should I keep my medication? Keep out of reach of children and pets. Store at room temperature between 15 and 30 degrees C (59 and 86 degrees F). Throw away any unused medication after the expiration date. NOTE: This sheet is a summary. It may not cover all possible information. If you have questions about this medicine, talk to your doctor, pharmacist, or health care provider.  2023 Elsevier/Gold Standard (2007-04-27 00:00:00)

## 2022-01-27 LAB — LIPID PANEL
Cholesterol: 239 mg/dL — ABNORMAL HIGH (ref <200)
HDL: 88 mg/dL (ref >=50)
LDL Cholesterol (Calc): 132 mg/dL (calc) — ABNORMAL HIGH
Non-HDL Cholesterol (Calc): 151 mg/dL (calc) — ABNORMAL HIGH (ref <130)
Total CHOL/HDL Ratio: 2.7 (calc) (ref <5.0)
Triglycerides: 88 mg/dL (ref <150)

## 2022-01-27 LAB — CBC WITH DIFFERENTIAL/PLATELET
Absolute Monocytes: 258 cells/uL (ref 200–950)
Basophils Absolute: 52 cells/uL (ref 0–200)
Basophils Relative: 1.2 %
Eosinophils Absolute: 30 cells/uL (ref 15–500)
Eosinophils Relative: 0.7 %
HCT: 41.1 % (ref 35.0–45.0)
Hemoglobin: 13.6 g/dL (ref 11.7–15.5)
Lymphs Abs: 1711 cells/uL (ref 850–3900)
MCH: 27.9 pg (ref 27.0–33.0)
MCHC: 33.1 g/dL (ref 32.0–36.0)
MCV: 84.4 fL (ref 80.0–100.0)
MPV: 9.6 fL (ref 7.5–12.5)
Monocytes Relative: 6 %
Neutro Abs: 2249 cells/uL (ref 1500–7800)
Neutrophils Relative %: 52.3 %
Platelets: 393 10*3/uL (ref 140–400)
RBC: 4.87 10*6/uL (ref 3.80–5.10)
RDW: 13.8 % (ref 11.0–15.0)
Total Lymphocyte: 39.8 %
WBC: 4.3 10*3/uL (ref 3.8–10.8)

## 2022-01-27 LAB — URINALYSIS, ROUTINE W REFLEX MICROSCOPIC
Bacteria, UA: NONE SEEN /HPF
Bilirubin Urine: NEGATIVE
Glucose, UA: NEGATIVE
Hgb urine dipstick: NEGATIVE
Hyaline Cast: NONE SEEN /LPF
Ketones, ur: NEGATIVE
Leukocytes,Ua: NEGATIVE
Nitrite: NEGATIVE
Protein, ur: NEGATIVE
RBC / HPF: NONE SEEN /HPF (ref 0–2)
Specific Gravity, Urine: 1.022 (ref 1.001–1.035)
WBC, UA: NONE SEEN /HPF (ref 0–5)
pH: 7 (ref 5.0–8.0)

## 2022-01-27 LAB — COMPLETE METABOLIC PANEL WITH GFR
AG Ratio: 1.6 (calc) (ref 1.0–2.5)
ALT: 14 U/L (ref 6–29)
AST: 17 U/L (ref 10–35)
Albumin: 4.4 g/dL (ref 3.6–5.1)
Alkaline phosphatase (APISO): 59 U/L (ref 37–153)
BUN: 15 mg/dL (ref 7–25)
CO2: 27 mmol/L (ref 20–32)
Calcium: 9.5 mg/dL (ref 8.6–10.4)
Chloride: 104 mmol/L (ref 98–110)
Creat: 0.77 mg/dL (ref 0.50–1.05)
Globulin: 2.7 g/dL (calc) (ref 1.9–3.7)
Glucose, Bld: 99 mg/dL (ref 65–99)
Potassium: 4.3 mmol/L (ref 3.5–5.3)
Sodium: 140 mmol/L (ref 135–146)
Total Bilirubin: 0.4 mg/dL (ref 0.2–1.2)
Total Protein: 7.1 g/dL (ref 6.1–8.1)
eGFR: 84 mL/min/{1.73_m2} (ref >=60)

## 2022-01-27 LAB — TSH: TSH: 0.75 mIU/L (ref 0.40–4.50)

## 2022-01-27 LAB — MAGNESIUM: Magnesium: 2.1 mg/dL (ref 1.5–2.5)

## 2022-01-27 LAB — VITAMIN D 25 HYDROXY (VIT D DEFICIENCY, FRACTURES): Vit D, 25-Hydroxy: 61 ng/mL (ref 30–100)

## 2022-01-27 LAB — HEMOGLOBIN A1C
Hgb A1c MFr Bld: 6.4 % of total Hgb — ABNORMAL HIGH (ref <5.7)
Mean Plasma Glucose: 137 mg/dL
eAG (mmol/L): 7.6 mmol/L

## 2022-01-27 LAB — MICROALBUMIN / CREATININE URINE RATIO
Creatinine, Urine: 168 mg/dL (ref 20–275)
Microalb Creat Ratio: 4 mcg/mg creat (ref <30)
Microalb, Ur: 0.6 mg/dL

## 2022-04-25 NOTE — Progress Notes (Unsigned)
Assessment and Plan:  There are no diagnoses linked to this encounter.    Further disposition pending results of labs. Discussed med's effects and SE's.   Over 30 minutes of exam, counseling, chart review, and critical decision making was performed.   Future Appointments  Date Time Provider [place]  04/26/2022 11:45 AM Alycia Rossetti, NP [facility] None  07/13/2022  3:00 PM Alycia Rossetti, NP [facility] None  01/30/2023 10:00 AM Darrol Jump, NP [facility] None    ------------------------------------------------------------------------------------------------------------------   HPI There were no vitals taken for this visit. 67 y.o.female presents for  Past Medical History:  Diagnosis Date   Migraines      No Known Allergies  Current Outpatient Medications on File Prior to Visit  Medication Sig   Calcium Carbonate (CALCIUM 600 PO) Take by mouth.   Cholecalciferol (D3) 50 MCG (2000 UT) TABS Take by mouth.   Cyanocobalamin (VITAMIN B 12 PO) Take by mouth.   escitalopram (LEXAPRO) 10 MG tablet Take 1 tablet (10 mg total) by mouth daily.   fexofenadine (ALLEGRA) 180 MG tablet Take 180 mg by mouth daily.   Magnesium 250 MG TABS Take by mouth.   Misc Natural Products (GLUCOSAMINE CHOND CMP TRIPLE PO) Take by mouth.   Omega-3 Fatty Acids (FISH OIL) 600 MG CAPS Take 600 mg by mouth. EPA 400 DHA   Potassium 99 MG TABS Take by mouth.   TURMERIC CURCUMIN PO Take 500 mg by mouth.   No current facility-administered medications on file prior to visit.    ROS: all negative except above.   Physical Exam:  There were no vitals taken for this visit.  General Appearance: Well nourished, in no apparent distress. Eyes: PERRLA, EOMs, conjunctiva no swelling or erythema Sinuses: No Frontal/maxillary tenderness ENT/Mouth: Ext aud canals clear, TMs without erythema, bulging. No erythema, swelling, or exudate on post pharynx.  Tonsils not swollen or erythematous. Hearing  normal.  Neck: Supple, thyroid normal.  Respiratory: Respiratory effort normal, BS equal bilaterally without rales, rhonchi, wheezing or stridor.  Cardio: RRR with no MRGs. Brisk peripheral pulses without edema.  Abdomen: Soft, + BS.  Non tender, no guarding, rebound, hernias, masses. Lymphatics: Non tender without lymphadenopathy.  Musculoskeletal: Full ROM, 5/5 strength, normal gait.  Skin: Warm, dry without rashes, lesions, ecchymosis.  Neuro: Cranial nerves intact. Normal muscle tone, no cerebellar symptoms. Sensation intact.  Psych: Awake and oriented X 3, normal affect, Insight and Judgment appropriate.     Alycia Rossetti, NP 12:24 PM [facility]

## 2022-04-26 ENCOUNTER — Ambulatory Visit (INDEPENDENT_AMBULATORY_CARE_PROVIDER_SITE_OTHER): Payer: Medicare Other | Admitting: Nurse Practitioner

## 2022-04-26 ENCOUNTER — Encounter: Payer: Self-pay | Admitting: Nurse Practitioner

## 2022-04-26 VITALS — BP 132/84 | HR 87 | Temp 97.9°F | Ht 65.0 in | Wt 152.6 lb

## 2022-04-26 DIAGNOSIS — R0989 Other specified symptoms and signs involving the circulatory and respiratory systems: Secondary | ICD-10-CM | POA: Diagnosis not present

## 2022-04-26 DIAGNOSIS — F419 Anxiety disorder, unspecified: Secondary | ICD-10-CM

## 2022-04-26 DIAGNOSIS — E663 Overweight: Secondary | ICD-10-CM

## 2022-04-26 DIAGNOSIS — S61210A Laceration without foreign body of right index finger without damage to nail, initial encounter: Secondary | ICD-10-CM | POA: Diagnosis not present

## 2022-04-26 MED ORDER — ESCITALOPRAM OXALATE 5 MG PO TABS: 5.0000 mg | ORAL_TABLET | Freq: Every day | ORAL | 3 refills | Status: DC

## 2022-06-15 ENCOUNTER — Other Ambulatory Visit: Payer: Self-pay

## 2022-06-15 ENCOUNTER — Encounter (HOSPITAL_BASED_OUTPATIENT_CLINIC_OR_DEPARTMENT_OTHER): Payer: Self-pay | Admitting: Emergency Medicine

## 2022-06-15 ENCOUNTER — Emergency Department (HOSPITAL_BASED_OUTPATIENT_CLINIC_OR_DEPARTMENT_OTHER)
Admission: EM | Admit: 2022-06-15 | Discharge: 2022-06-15 | Disposition: A | Discharge status: Home / Self Care | Payer: Medicare Other | Attending: Emergency Medicine | Admitting: Emergency Medicine

## 2022-06-15 DIAGNOSIS — S0121XA Laceration without foreign body of nose, initial encounter: Secondary | ICD-10-CM | POA: Diagnosis not present

## 2022-06-15 DIAGNOSIS — W228XXA Striking against or struck by other objects, initial encounter: Secondary | ICD-10-CM | POA: Diagnosis not present

## 2022-06-15 DIAGNOSIS — R42 Dizziness and giddiness: Secondary | ICD-10-CM | POA: Diagnosis not present

## 2022-06-15 DIAGNOSIS — R410 Disorientation, unspecified: Secondary | ICD-10-CM | POA: Diagnosis not present

## 2022-06-15 DIAGNOSIS — S0992XA Unspecified injury of nose, initial encounter: Secondary | ICD-10-CM | POA: Diagnosis not present

## 2022-06-15 NOTE — Discharge Instructions (Addendum)
Your exam here today is normal.  The small cut on your nose appears to be healing well Your blood pressure was elevated at 138/80.  Please have this rechecked as an outpatient as it can be elevated especially in the setting of an acute visit. Otherwise, he has a normal exam and no further evaluation appears to be indicated today. However, if you are feeling worse, especially weak on one side or the other, new headache, or other new symptoms please return for evaluation
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
## 2022-06-19 ENCOUNTER — Telehealth: Payer: Self-pay

## 2022-06-19 NOTE — Telephone Encounter (Signed)
     Patient  visit on 3/28  at [facility]    Have you been able to follow up with your primary care physician? Yes   The patient was or was not able to obtain any needed medicine or equipment. na  Are there diet recommendations that you are having difficulty following? Na   Patient expresses understanding of discharge instructions and education provided has no other needs at this time. Yes    [facility] [phone] [address], [place], [place] [address] Phone: [phone] Email: [email or web address]

## 2022-07-12 NOTE — Progress Notes (Unsigned)
MEDICARE ANNUAL WELLNESS VISIT AND FOLLOW UP  Assessment:   Jacqueline Carson was seen today for medicare wellness.  Diagnoses and all orders for this visit:  Encounter for Medicare annual wellness exam Encouraged to schedule mammogram Cologuard UTD and negative  Labile hypertension - continue DASH diet, exercise and monitor at home. Call if greater than 130/80.  - Controlled without medications -     CBC with Differential/Platelet  Anxiety Continue lexapro 5 mg QD Diet and exercise Practice good sleep hygiene  Overweight (BMI 25.0-29.9) Continue diet, exercise -     TSH  Abnormal glucose Continue diet and exercise -     COMPLETE METABOLIC PANEL WITH GFR  Hyperlipidemia, mixed -     COMPLETE METABOLIC PANEL WITH GFR -     Lipid panel  Vitamin D deficiency Continue Vit D supplementation to maintain value in therapeutic level of 60-100   Medication management -     TSH    Over 40 minutes of exam, counseling, chart review and critical decision making was performed Future Appointments  Date Time Provider Department Center  01/30/2023 10:00 AM Adela Glimpse, NP [facility] None  07/24/2023  3:00 PM Raynelle Dick, NP [facility] None     Plan:   During the course of the visit the patient was educated and counseled about appropriate screening and preventive services including:   Pneumococcal vaccine  Prevnar 13 Influenza vaccine Td vaccine Screening electrocardiogram Bone densitometry screening Colorectal cancer screening Diabetes screening Glaucoma screening Nutrition counseling  Advanced directives: requested   Subjective:  Jacqueline Carson is a 67 y.o. female who presents for Medicare Annual Wellness Visit and 3 month follow up.   She has had labile hypertension currently controlled without medication. Her blood pressure has been controlled at home, today their BP is BP: 122/82  BP Readings from Last 3 Encounters:  07/13/22 122/82  06/15/22 (!) 138/100   04/26/22 132/84  She does workout. She denies chest pain, shortness of breath, dizziness.   Catarracts were both removed last year at [facility].   Anxiety has gotten worse but is still doing her relaxation exercises. She is on Lexapro 5 mg and side effects have resolved.   BMI is Body mass index is 26.26 kg/m., she has been working on diet and exercise. Wt Readings from Last 3 Encounters:  07/13/22 157 lb 12.8 oz (71.6 kg)  06/15/22 150 lb (68 kg)  04/26/22 152 lb 9.6 oz (69.2 kg)     She is not on cholesterol medication but takes Omega  3 Fatty acids. Her cholesterol is not at goal. She is limiting red meat, diary , eggs and saturated fats The cholesterol last visit was:   Lab Results  Component Value Date   CHOL 239 (H) 01/26/2022   HDL 88 01/26/2022   LDLCALC 132 (H) 01/26/2022   TRIG 88 01/26/2022   CHOLHDL 2.7 01/26/2022   She has abnormal glucose. Last A1C in the office was:  Lab Results  Component Value Date   HGBA1C 6.4 (H) 01/26/2022   She is drinking 4-6 bottles of water a day. Last GFR: Lab Results  Component Value Date   EGFR 84 01/26/2022    Patient is on Vitamin D supplement.   Lab Results  Component Value Date   VD25OH 61 01/26/2022      Medication Review: Current Outpatient Medications on File Prior to Visit  Medication Sig Dispense Refill   Calcium Carbonate (CALCIUM 600 PO) Take by mouth.  Cetirizine HCl (ZYRTEC ALLERGY) 10 MG CAPS Take by mouth.     Cholecalciferol (D3) 50 MCG (2000 UT) TABS Take by mouth.     Cyanocobalamin (VITAMIN B 12 PO) Take by mouth.     escitalopram (LEXAPRO) 5 MG tablet Take 1 tablet (5 mg total) by mouth daily. 30 tablet 3   fexofenadine (ALLEGRA) 180 MG tablet Take 180 mg by mouth daily.     Magnesium 250 MG TABS Take by mouth.     Misc Natural Products (GLUCOSAMINE CHOND CMP TRIPLE PO) Take by mouth.     Potassium 99 MG TABS Take by mouth.     TURMERIC CURCUMIN PO Take 500 mg by mouth.     Omega-3 Fatty  Acids (FISH OIL) 600 MG CAPS Take 600 mg by mouth. EPA 400 DHA (Patient not taking: Reported on 07/13/2022)     No current facility-administered medications on file prior to visit.    No Known Allergies  Current Problems (verified) Patient Active Problem List   Diagnosis Date Noted   Mixed hyperlipidemia 06/08/2021   Vitamin D deficiency 06/08/2021   Abnormal glucose 06/08/2021   Overweight (BMI 25.0-29.9) 06/08/2021    Screening Tests  Immunization History  Administered Date(s) Administered   PFIZER Comirnaty(Gray Top)Covid-19 Tri-Sucrose Vaccine 02/11/2022   PFIZER(Purple Top)SARS-COV-2 Vaccination 06/12/2019, 07/04/2019   Pfizer Covid-19 Vaccine Bivalent Booster 38yrs & up 02/10/2020, 04/08/2021   Td 04/26/2022     Health Maintenance  Topic Date Due   COVID-19 Vaccine (6 - 2023-24 season) 07/29/2022 (Originally 04/08/2022)   Zoster Vaccines- Shingrix (1 of 2) 10/12/2022 (Originally 10/11/1973)   Pneumonia Vaccine 57+ Years old (1 of 1 - PCV) 10/20/2022 (Originally 10/12/2019)   DEXA SCAN  10/20/2022 (Originally 10/12/2019)   COLONOSCOPY (Pts 45-32yrs Insurance coverage will need to be confirmed)  10/20/2022 (Originally 10/12/1999)   Hepatitis C Screening  10/20/2022 (Originally 10/11/1973)   INFLUENZA VACCINE  10/19/2022   MAMMOGRAM  02/05/2023   Medicare Annual Wellness (AWV)  07/13/2023   DTaP/Tdap/Td (2 - Tdap) 04/26/2032   HPV VACCINES  Aged Out   Cologuard 01/2021 negative  Names of Other Physician/Practitioners you currently use: 1. [facility] here for primary care 2. [facility], eye doctor, last visit 09/2021 3. Dr. Alto [place], dentist, last visit 07/13/22 Patient Care Team: Lucky Cowboy, MD as PCP - General (Internal Medicine)  SURGICAL HISTORY She  has a past surgical history that includes Eye surgery (Bilateral) and Breast biopsy (Left, 1990). FAMILY HISTORY Her family history includes Breast cancer in her paternal  grandmother; Diabetes in her sister; Heart attack in her paternal grandfather; Hypertension in her sister; Non-Hodgkin's lymphoma in her father; Pulmonary embolism in her mother; Skin cancer in her sister. SOCIAL HISTORY She  reports that she has never smoked. She has never used smokeless tobacco. She reports current alcohol use of about 3.0 standard drinks of alcohol per week. She reports that she does not use drugs.   MEDICARE WELLNESS OBJECTIVES: Physical activity: Current Exercise Habits: Home exercise routine, Type of exercise: walking, Time (Minutes): 60, Frequency (Times/Week): 6, Weekly Exercise (Minutes/Week): 360, Intensity: Mild Cardiac risk factors: Cardiac Risk Factors include: advanced age (>49men, >28 women);dyslipidemia;hypertension Depression/mood screen:      07/13/2022    3:44 PM  Depression screen PHQ 2/9  Decreased Interest 1  Down, Depressed, Hopeless 1  PHQ - 2 Score 2  Altered sleeping 0  Tired, decreased energy 1  Change in appetite 1  Feeling bad or failure  about yourself  1  Trouble concentrating 0  Moving slowly or fidgety/restless 0  Suicidal thoughts 0  PHQ-9 Score 5  Difficult doing work/chores Somewhat difficult    ADLs:     07/13/2022    3:47 PM 10/19/2021    2:45 PM  In your present state of health, do you have any difficulty performing the following activities:  Hearing? 0 0  Vision? 0 0  Difficulty concentrating or making decisions? 0 0  Walking or climbing stairs? 0 0  Dressing or bathing? 0 0  Doing errands, shopping? 0 0     Cognitive Testing  Alert? Yes  Normal Appearance?Yes  Oriented to person? Yes  Place? Yes   Time? Yes  Recall of three objects?  Yes  Can perform simple calculations? Yes  Displays appropriate judgment?Yes  Can read the correct time from a watch face?Yes  EOL planning: Does Patient Have a Medical Advance Directive?: Yes Type of Advance Directive: Healthcare Power of Attorney, Living will Does patient want to  make changes to medical advance directive?: No - Patient declined Copy of Healthcare Power of Attorney in Chart?: No - copy requested  Review of Systems  Constitutional:  Negative for chills, fever and weight loss.  HENT:  Negative for congestion and hearing loss.   Eyes:  Negative for blurred vision and double vision.  Respiratory:  Negative for cough and shortness of breath.   Cardiovascular:  Negative for chest pain, palpitations, orthopnea and leg swelling.  Gastrointestinal:  Negative for abdominal pain, constipation, diarrhea, heartburn, nausea and vomiting.  Musculoskeletal:  Negative for falls, joint pain and myalgias.  Skin:  Negative for rash.  Neurological:  Negative for dizziness, tingling, tremors, loss of consciousness and headaches.  Psychiatric/Behavioral:  Negative for depression, memory loss and suicidal ideas.      Objective:     Today's Vitals   07/13/22 1529  BP: 122/82  Pulse: 83  Temp: 98.4 F (36.9 C)  SpO2: 94%  Weight: 157 lb 12.8 oz (71.6 kg)  Height:  (1.651 m)   Body mass index is 26.26 kg/m.  General appearance: alert, no distress, WD/WN, female HEENT: normocephalic, sclerae anicteric, TMs pearly, nares patent, no discharge or erythema, pharynx normal Oral cavity: MMM, no lesions Neck: supple, no lymphadenopathy, no thyromegaly, no masses Heart: RRR, normal S1, S2, no murmurs Lungs: CTA bilaterally, no wheezes, rhonchi, or rales Abdomen: +bs, soft, non tender, non distended, no masses, no hepatomegaly, no splenomegaly Musculoskeletal: nontender, no swelling, no obvious deformity Extremities: no edema, no cyanosis, no clubbing Pulses: 2+ symmetric, upper and lower extremities, normal cap refill Neurological: alert, oriented x 3, CN2-12 intact, strength normal upper extremities and lower extremities, sensation normal throughout, DTRs 2+ throughout, no cerebellar signs, gait normal Psychiatric: normal affect, behavior normal, pleasant    Medicare Attestation I have personally reviewed: The patient's medical and social history Their use of alcohol, tobacco or illicit drugs Their current medications and supplements The patient's functional ability including ADLs,fall risks, home safety risks, cognitive, and hearing and visual impairment Diet and physical activities Evidence for depression or mood disorders  The patient's weight, height, BMI, and visual acuity have been recorded in the chart.  I have made referrals, counseling, and provided education to the patient based on review of the above and I have provided the patient with a written personalized care plan for preventive services.     Raynelle Dick, NP   07/13/2022

## 2022-07-13 ENCOUNTER — Ambulatory Visit (INDEPENDENT_AMBULATORY_CARE_PROVIDER_SITE_OTHER): Payer: Medicare Other | Admitting: Nurse Practitioner

## 2022-07-13 ENCOUNTER — Encounter: Payer: Self-pay | Admitting: Nurse Practitioner

## 2022-07-13 VITALS — BP 122/82 | HR 83 | Temp 98.4°F | Ht 65.0 in | Wt 157.8 lb

## 2022-07-13 DIAGNOSIS — E663 Overweight: Secondary | ICD-10-CM | POA: Diagnosis not present

## 2022-07-13 DIAGNOSIS — E559 Vitamin D deficiency, unspecified: Secondary | ICD-10-CM | POA: Diagnosis not present

## 2022-07-13 DIAGNOSIS — F419 Anxiety disorder, unspecified: Secondary | ICD-10-CM

## 2022-07-13 DIAGNOSIS — Z79899 Other long term (current) drug therapy: Secondary | ICD-10-CM

## 2022-07-13 DIAGNOSIS — Z6824 Body mass index (BMI) 24.0-24.9, adult: Secondary | ICD-10-CM | POA: Diagnosis not present

## 2022-07-13 DIAGNOSIS — Z0001 Encounter for general adult medical examination with abnormal findings: Secondary | ICD-10-CM | POA: Diagnosis not present

## 2022-07-13 DIAGNOSIS — R6889 Other general symptoms and signs: Secondary | ICD-10-CM | POA: Diagnosis not present

## 2022-07-13 DIAGNOSIS — R7309 Other abnormal glucose: Secondary | ICD-10-CM | POA: Diagnosis not present

## 2022-07-13 DIAGNOSIS — Z0000 Encounter for general adult medical examination without abnormal findings: Secondary | ICD-10-CM

## 2022-07-13 DIAGNOSIS — R0989 Other specified symptoms and signs involving the circulatory and respiratory systems: Secondary | ICD-10-CM | POA: Diagnosis not present

## 2022-07-13 DIAGNOSIS — E782 Mixed hyperlipidemia: Secondary | ICD-10-CM | POA: Diagnosis not present

## 2022-07-13 NOTE — Patient Instructions (Signed)

## 2022-07-14 LAB — CBC WITH DIFFERENTIAL/PLATELET
Absolute Monocytes: 403 cells/uL (ref 200–950)
Basophils Absolute: 38 cells/uL (ref 0–200)
Basophils Relative: 0.8 %
Eosinophils Absolute: 53 cells/uL (ref 15–500)
Eosinophils Relative: 1.1 %
HCT: 41.4 % (ref 35.0–45.0)
Hemoglobin: 13.4 g/dL (ref 11.7–15.5)
Lymphs Abs: 1838 cells/uL (ref 850–3900)
MCH: 27.7 pg (ref 27.0–33.0)
MCHC: 32.4 g/dL (ref 32.0–36.0)
MCV: 85.5 fL (ref 80.0–100.0)
MPV: 9.5 fL (ref 7.5–12.5)
Monocytes Relative: 8.4 %
Neutro Abs: 2467 cells/uL (ref 1500–7800)
Neutrophils Relative %: 51.4 %
Platelets: 359 10*3/uL (ref 140–400)
RBC: 4.84 10*6/uL (ref 3.80–5.10)
RDW: 13.1 % (ref 11.0–15.0)
Total Lymphocyte: 38.3 %
WBC: 4.8 10*3/uL (ref 3.8–10.8)

## 2022-07-14 LAB — LIPID PANEL
Cholesterol: 227 mg/dL — ABNORMAL HIGH (ref <200)
HDL: 71 mg/dL (ref >=50)
LDL Cholesterol (Calc): 127 mg/dL (calc) — ABNORMAL HIGH
Non-HDL Cholesterol (Calc): 156 mg/dL (calc) — ABNORMAL HIGH (ref <130)
Total CHOL/HDL Ratio: 3.2 (calc) (ref <5.0)
Triglycerides: 176 mg/dL — ABNORMAL HIGH (ref <150)

## 2022-07-14 LAB — COMPLETE METABOLIC PANEL WITH GFR
AG Ratio: 1.8 (calc) (ref 1.0–2.5)
ALT: 16 U/L (ref 6–29)
AST: 16 U/L (ref 10–35)
Albumin: 4.3 g/dL (ref 3.6–5.1)
Alkaline phosphatase (APISO): 59 U/L (ref 37–153)
BUN: 13 mg/dL (ref 7–25)
CO2: 26 mmol/L (ref 20–32)
Calcium: 9 mg/dL (ref 8.6–10.4)
Chloride: 107 mmol/L (ref 98–110)
Creat: 0.7 mg/dL (ref 0.50–1.05)
Globulin: 2.4 g/dL (calc) (ref 1.9–3.7)
Glucose, Bld: 106 mg/dL — ABNORMAL HIGH (ref 65–99)
Potassium: 4.5 mmol/L (ref 3.5–5.3)
Sodium: 143 mmol/L (ref 135–146)
Total Bilirubin: 0.3 mg/dL (ref 0.2–1.2)
Total Protein: 6.7 g/dL (ref 6.1–8.1)
eGFR: 95 mL/min/{1.73_m2} (ref >=60)

## 2022-07-14 LAB — TSH: TSH: 0.72 mIU/L (ref 0.40–4.50)

## 2022-08-13 ENCOUNTER — Other Ambulatory Visit: Payer: Self-pay | Admitting: Nurse Practitioner

## 2022-08-13 DIAGNOSIS — F419 Anxiety disorder, unspecified: Secondary | ICD-10-CM
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
## 2023-01-30 ENCOUNTER — Encounter: Payer: Self-pay | Admitting: Nurse Practitioner

## 2023-01-30 ENCOUNTER — Encounter: Payer: Medicare Other | Admitting: Nurse Practitioner

## 2023-01-30 ENCOUNTER — Ambulatory Visit (INDEPENDENT_AMBULATORY_CARE_PROVIDER_SITE_OTHER): Payer: Medicare Other | Admitting: Nurse Practitioner

## 2023-01-30 VITALS — BP 130/82 | HR 95 | Temp 97.9°F | Ht 65.0 in | Wt 160.4 lb

## 2023-01-30 DIAGNOSIS — E782 Mixed hyperlipidemia: Secondary | ICD-10-CM | POA: Diagnosis not present

## 2023-01-30 DIAGNOSIS — Z1389 Encounter for screening for other disorder: Secondary | ICD-10-CM

## 2023-01-30 DIAGNOSIS — Z136 Encounter for screening for cardiovascular disorders: Secondary | ICD-10-CM | POA: Diagnosis not present

## 2023-01-30 DIAGNOSIS — Z1329 Encounter for screening for other suspected endocrine disorder: Secondary | ICD-10-CM

## 2023-01-30 DIAGNOSIS — E559 Vitamin D deficiency, unspecified: Secondary | ICD-10-CM | POA: Diagnosis not present

## 2023-01-30 DIAGNOSIS — Z79899 Other long term (current) drug therapy: Secondary | ICD-10-CM | POA: Diagnosis not present

## 2023-01-30 DIAGNOSIS — R0989 Other specified symptoms and signs involving the circulatory and respiratory systems: Secondary | ICD-10-CM | POA: Diagnosis not present

## 2023-01-30 DIAGNOSIS — I700 Atherosclerosis of aorta: Secondary | ICD-10-CM

## 2023-01-30 DIAGNOSIS — F419 Anxiety disorder, unspecified: Secondary | ICD-10-CM

## 2023-01-30 DIAGNOSIS — Z0000 Encounter for general adult medical examination without abnormal findings: Secondary | ICD-10-CM

## 2023-01-30 DIAGNOSIS — Z6824 Body mass index (BMI) 24.0-24.9, adult: Secondary | ICD-10-CM

## 2023-01-30 DIAGNOSIS — E663 Overweight: Secondary | ICD-10-CM

## 2023-01-30 DIAGNOSIS — R7309 Other abnormal glucose: Secondary | ICD-10-CM

## 2023-01-30 DIAGNOSIS — Z0001 Encounter for general adult medical examination with abnormal findings: Secondary | ICD-10-CM

## 2023-01-30 MED ORDER — ESCITALOPRAM OXALATE 10 MG PO TABS: 10.0000 mg | ORAL_TABLET | Freq: Every day | ORAL | 2 refills | Status: DC

## 2023-01-30 NOTE — Patient Instructions (Addendum)
Increase Lexapro to 10 mg daily.  Can double 5 mg dose Follow up in 1 month to recheck  Managing Anxiety, Adult After being diagnosed with anxiety, you may be relieved to know why you have felt or behaved a certain way. You may also feel overwhelmed about the treatment ahead and what it will mean for your life. With care and support, you can manage your anxiety. How to manage lifestyle changes Understanding the difference between stress and anxiety Although stress can play a role in anxiety, it is not the same as anxiety. Stress is your body's reaction to life changes and events, both good and bad. Stress is often caused by something external, such as a deadline, test, or competition. It normally goes away after the event has ended and will last just a few hours. But, stress can be ongoing and can lead to more than just stress. Anxiety is caused by something internal, such as imagining a terrible outcome or worrying that something will go wrong that will greatly upset you. Anxiety often does not go away even after the event is over, and it can become a long-term (chronic) worry. Lowering stress and anxiety Talk with your health care provider or a counselor to learn more about lowering anxiety and stress. They may suggest tension-reduction techniques, such as: Music. Spend time creating or listening to music that you enjoy and that inspires you. Mindfulness-based meditation. Practice being aware of your normal breaths while not trying to control your breathing. It can be done while sitting or walking. Centering prayer. Focus on a word, phrase, or sacred image that means something to you and brings you peace. Deep breathing. Expand your stomach and inhale slowly through your nose. Hold your breath for 3-5 seconds. Then breathe out slowly, letting your stomach muscles relax. Self-talk. Learn to notice and spot thought patterns that lead to anxiety reactions. Change those patterns to thoughts that feel  peaceful. Muscle relaxation. Take time to tense muscles and then relax them. Choose a tension-reduction technique that fits your lifestyle and personality. These techniques take time and practice. Set aside 5-15 minutes a day to do them. Specialized therapists can offer counseling and training in these techniques. The training to help with anxiety may be covered by some insurance plans. Other things you can do to manage stress and anxiety include: Keeping a stress diary. This can help you learn what triggers your reaction and then learn ways to manage your response. Thinking about how you react to certain situations. You may not be able to control everything, but you can control your response. Making time for activities that help you relax and not feeling guilty about spending your time in this way. Doing visual imagery. This involves imagining or creating mental pictures to help you relax. Practicing yoga. Through yoga poses, you can lower tension and relax.  Medicines Medicines for anxiety include: Antidepressant medicines. These are usually prescribed for long-term daily control. Anti-anxiety medicines. These may be added in severe cases, especially when panic attacks occur. When used together, medicines, psychotherapy, and tension-reduction techniques may be the most effective treatment. Relationships Relationships can play a big part in helping you recover. Spend more time connecting with trusted friends and family members. Think about going to couples counseling if you have a partner, taking family education classes, or going to family therapy. Therapy can help you and others better understand your anxiety. How to recognize changes in your anxiety Everyone responds differently to treatment for anxiety. Recovery from anxiety  happens when symptoms lessen and stop interfering with your daily life at home or work. This may mean that you will start to: Have better concentration and focus. Worry  will interfere less in your daily thinking. Sleep better. Be less irritable. Have more energy. Have improved memory. Try to recognize when your condition is getting worse. Contact your provider if your symptoms interfere with home or work and you feel like your condition is not improving. Follow these instructions at home: Activity Exercise. Adults should: Exercise for at least 150 minutes each week. The exercise should increase your heart rate and make you sweat (moderate-intensity exercise). Do strengthening exercises at least twice a week. Get the right amount and quality of sleep. Most adults need 7-9 hours of sleep each night. Lifestyle  Eat a healthy diet that includes plenty of vegetables, fruits, whole grains, low-fat dairy products, and lean protein. Do not eat a lot of foods that are high in fats, added sugars, or salt (sodium). Make choices that simplify your life. Do not use any products that contain nicotine or tobacco. These products include cigarettes, chewing tobacco, and vaping devices, such as e-cigarettes. If you need help quitting, ask your provider. Avoid caffeine, alcohol, and certain over-the-counter cold medicines. These may make you feel worse. Ask your pharmacist which medicines to avoid. General instructions Take over-the-counter and prescription medicines only as told by your provider. Keep all follow-up visits. This is to make sure you are managing your anxiety well or if you need more support. Where to find support You can get help and support from: Self-help groups. Online and [employer]. A trusted spiritual leader. Couples counseling. Family education classes. Family therapy. Where to find more information You may find that joining a support group helps you deal with your anxiety. The following sources can help you find counselors or support groups near you: Mental Health America: mentalhealthamerica.net Anxiety and Depression Association  of [place] (ADAA): adaa.org [employer] Mental Illness (NAMI): nami.org Contact a health care provider if: You have a hard time staying focused or finishing tasks. You spend many hours a day feeling worried about everyday life. You are very tired because you cannot stop worrying. You start to have headaches or often feel tense. You have chronic nausea or diarrhea. Get help right away if: Your heart feels like it is racing. You have shortness of breath. You have thoughts of hurting yourself or others. Get help right away if you feel like you may hurt yourself or others, or have thoughts about taking your own life. Go to your nearest emergency room or: Call 911. Call the National Suicide Prevention Lifeline at 7157930015 or 988. This is open 24 hours a day. Text the Crisis Text Line at 3125611009. This information is not intended to replace advice given to you by your health care provider. Make sure you discuss any questions you have with your health care provider. Document Revised: 12/13/2021 Document Reviewed: 06/27/2020 Elsevier Patient Education  2024 [employer].

## 2023-01-31 LAB — CBC WITH DIFFERENTIAL/PLATELET
Absolute Lymphocytes: 2229 {cells}/uL (ref 850–3900)
Absolute Monocytes: 422 {cells}/uL (ref 200–950)
Basophils Absolute: 40 {cells}/uL (ref 0–200)
Basophils Relative: 0.7 %
Eosinophils Absolute: 23 {cells}/uL (ref 15–500)
Eosinophils Relative: 0.4 %
HCT: 43.2 % (ref 35.0–45.0)
Hemoglobin: 13.9 g/dL (ref 11.7–15.5)
MCH: 27.9 pg (ref 27.0–33.0)
MCHC: 32.2 g/dL (ref 32.0–36.0)
MCV: 86.6 fL (ref 80.0–100.0)
MPV: 9.7 fL (ref 7.5–12.5)
Monocytes Relative: 7.4 %
Neutro Abs: 2987 {cells}/uL (ref 1500–7800)
Neutrophils Relative %: 52.4 %
Platelets: 349 10*3/uL (ref 140–400)
RBC: 4.99 10*6/uL (ref 3.80–5.10)
RDW: 13.2 % (ref 11.0–15.0)
Total Lymphocyte: 39.1 %
WBC: 5.7 10*3/uL (ref 3.8–10.8)

## 2023-01-31 LAB — COMPLETE METABOLIC PANEL WITH GFR
AG Ratio: 1.5 (calc) (ref 1.0–2.5)
ALT: 17 U/L (ref 6–29)
AST: 16 U/L (ref 10–35)
Albumin: 4.3 g/dL (ref 3.6–5.1)
Alkaline phosphatase (APISO): 67 U/L (ref 37–153)
BUN: 11 mg/dL (ref 7–25)
CO2: 25 mmol/L (ref 20–32)
Calcium: 9.6 mg/dL (ref 8.6–10.4)
Chloride: 106 mmol/L (ref 98–110)
Creat: 0.65 mg/dL (ref 0.50–1.05)
Globulin: 2.8 g/dL (ref 1.9–3.7)
Glucose, Bld: 103 mg/dL — ABNORMAL HIGH (ref 65–99)
Potassium: 4.2 mmol/L (ref 3.5–5.3)
Sodium: 140 mmol/L (ref 135–146)
Total Bilirubin: 0.4 mg/dL (ref 0.2–1.2)
Total Protein: 7.1 g/dL (ref 6.1–8.1)
eGFR: 96 mL/min/{1.73_m2} (ref >=60)

## 2023-01-31 LAB — LIPID PANEL
Cholesterol: 264 mg/dL — ABNORMAL HIGH (ref <200)
HDL: 74 mg/dL (ref >=50)
LDL Cholesterol (Calc): 158 mg/dL — ABNORMAL HIGH
Non-HDL Cholesterol (Calc): 190 mg/dL — ABNORMAL HIGH (ref <130)
Total CHOL/HDL Ratio: 3.6 (calc) (ref <5.0)
Triglycerides: 175 mg/dL — ABNORMAL HIGH (ref <150)

## 2023-01-31 LAB — MICROALBUMIN / CREATININE URINE RATIO
Creatinine, Urine: 54 mg/dL (ref 20–275)
Microalb Creat Ratio: 6 mg/g{creat} (ref <30)
Microalb, Ur: 0.3 mg/dL

## 2023-01-31 LAB — URINALYSIS, ROUTINE W REFLEX MICROSCOPIC
Bilirubin Urine: NEGATIVE
Glucose, UA: NEGATIVE
Hgb urine dipstick: NEGATIVE
Ketones, ur: NEGATIVE
Leukocytes,Ua: NEGATIVE
Nitrite: NEGATIVE
Protein, ur: NEGATIVE
Specific Gravity, Urine: 1.01 (ref 1.001–1.035)
pH: 7 (ref 5.0–8.0)

## 2023-01-31 LAB — VITAMIN D 25 HYDROXY (VIT D DEFICIENCY, FRACTURES): Vit D, 25-Hydroxy: 87 ng/mL (ref 30–100)

## 2023-01-31 LAB — MAGNESIUM: Magnesium: 2.2 mg/dL (ref 1.5–2.5)

## 2023-01-31 LAB — TSH: TSH: 0.56 m[IU]/L (ref 0.40–4.50)

## 2023-01-31 LAB — HEMOGLOBIN A1C W/OUT EAG: Hgb A1c MFr Bld: 6.1 %{Hb} — ABNORMAL HIGH (ref <5.7)

## 2023-02-02 ENCOUNTER — Other Ambulatory Visit: Payer: Self-pay | Admitting: Nurse Practitioner

## 2023-02-02 DIAGNOSIS — Z1231 Encounter for screening mammogram for malignant neoplasm of breast: Secondary | ICD-10-CM
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
## 2023-02-27 ENCOUNTER — Ambulatory Visit (INDEPENDENT_AMBULATORY_CARE_PROVIDER_SITE_OTHER): Payer: Medicare Other | Admitting: Nurse Practitioner

## 2023-02-27 ENCOUNTER — Encounter: Payer: Self-pay | Admitting: Nurse Practitioner

## 2023-02-27 VITALS — BP 116/80 | HR 80 | Temp 97.3°F | Ht 65.0 in | Wt 157.0 lb

## 2023-02-27 DIAGNOSIS — F419 Anxiety disorder, unspecified: Secondary | ICD-10-CM

## 2023-02-27 DIAGNOSIS — R0989 Other specified symptoms and signs involving the circulatory and respiratory systems: Secondary | ICD-10-CM

## 2023-02-27 DIAGNOSIS — E663 Overweight: Secondary | ICD-10-CM | POA: Diagnosis not present

## 2023-02-27 NOTE — Patient Instructions (Signed)

## 2023-03-08 ENCOUNTER — Ambulatory Visit
Admission: RE | Admit: 2023-03-08 | Discharge: 2023-03-08 | Disposition: A | Discharge status: Home / Self Care | Payer: Medicare Other | Source: Ambulatory Visit | Attending: Nurse Practitioner | Admitting: Nurse Practitioner

## 2023-03-08 DIAGNOSIS — Z1231 Encounter for screening mammogram for malignant neoplasm of breast: Secondary | ICD-10-CM

## 2023-03-15 ENCOUNTER — Other Ambulatory Visit: Payer: Self-pay | Admitting: Nurse Practitioner

## 2023-03-15 DIAGNOSIS — R928 Other abnormal and inconclusive findings on diagnostic imaging of breast: Secondary | ICD-10-CM

## 2023-03-30 ENCOUNTER — Ambulatory Visit
Admission: RE | Admit: 2023-03-30 | Discharge: 2023-03-30 | Disposition: A | Discharge status: Home / Self Care | Payer: PPO | Source: Ambulatory Visit | Attending: Nurse Practitioner | Admitting: Nurse Practitioner

## 2023-03-30 ENCOUNTER — Ambulatory Visit
Admission: RE | Admit: 2023-03-30 | Discharge: 2023-03-30 | Disposition: A | Discharge status: Home / Self Care | Payer: Self-pay | Source: Ambulatory Visit | Attending: Nurse Practitioner

## 2023-03-30 DIAGNOSIS — R928 Other abnormal and inconclusive findings on diagnostic imaging of breast: Secondary | ICD-10-CM

## 2023-03-31 ENCOUNTER — Other Ambulatory Visit: Payer: Medicare Other

## 2023-04-05 ENCOUNTER — Other Ambulatory Visit: Payer: Medicare Other

## 2023-05-01 ENCOUNTER — Other Ambulatory Visit: Payer: Self-pay

## 2023-05-01 DIAGNOSIS — F419 Anxiety disorder, unspecified: Secondary | ICD-10-CM

## 2023-05-01 MED ORDER — ESCITALOPRAM OXALATE 10 MG PO TABS: 10.0000 mg | ORAL_TABLET | Freq: Every day | ORAL | 0 refills | Status: AC

## 2023-05-18 IMAGING — MG MM DIGITAL SCREENING BILAT W/ TOMO AND CAD
8 series · 8 of 24 positions shown · non-contrast
Comparison: Previous exam(s).

CLINICAL DATA: Screening.

EXAM:
DIGITAL SCREENING BILATERAL MAMMOGRAM WITH TOMOSYNTHESIS AND CAD
TECHNIQUE: Bilateral screening digital craniocaudal and mediolateral oblique
mammograms were obtained. Bilateral screening digital breast
tomosynthesis was performed. The images were evaluated with
computer-aided detection.

[L CC synth-2D]
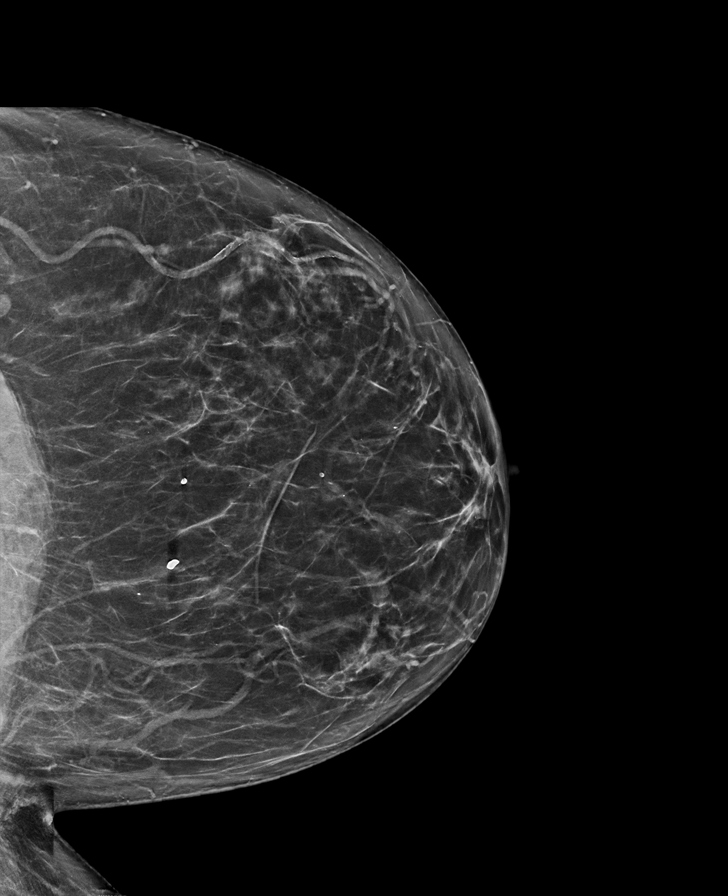

[R CC synth-2D]
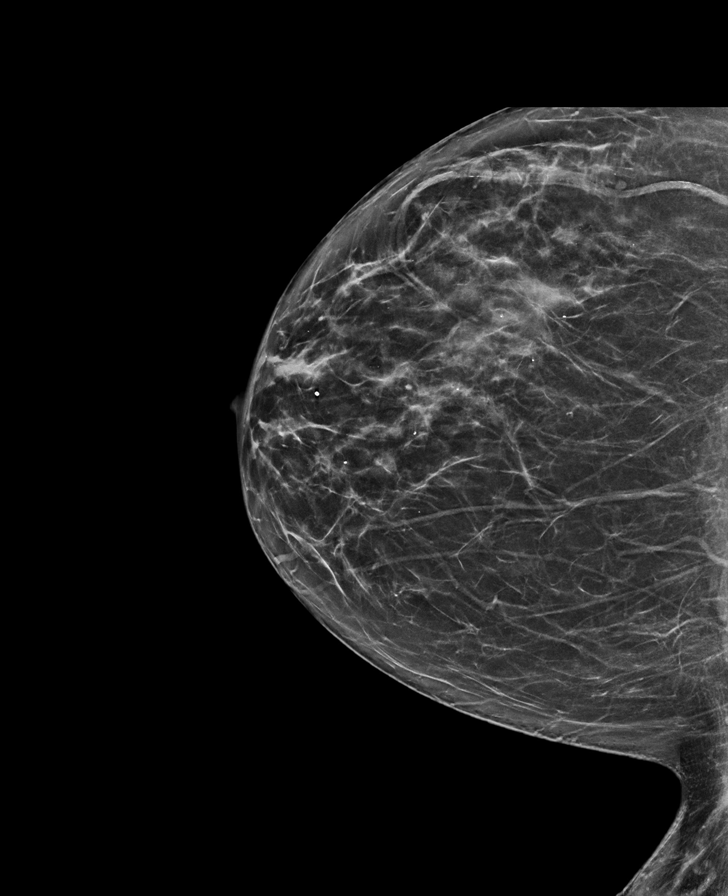

[R MLO synth-2D]
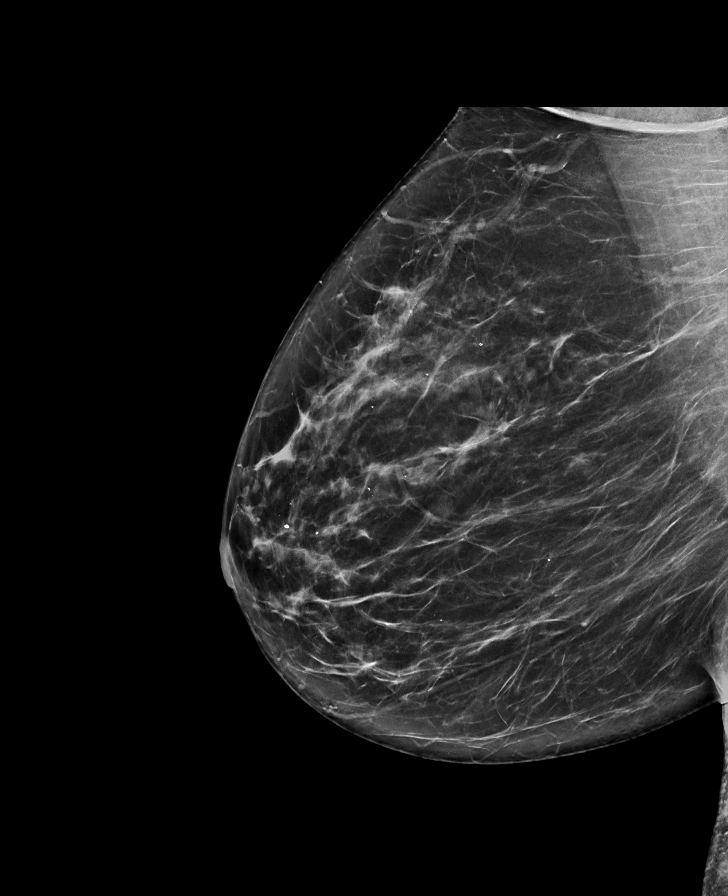

[L MLO synth-2D]
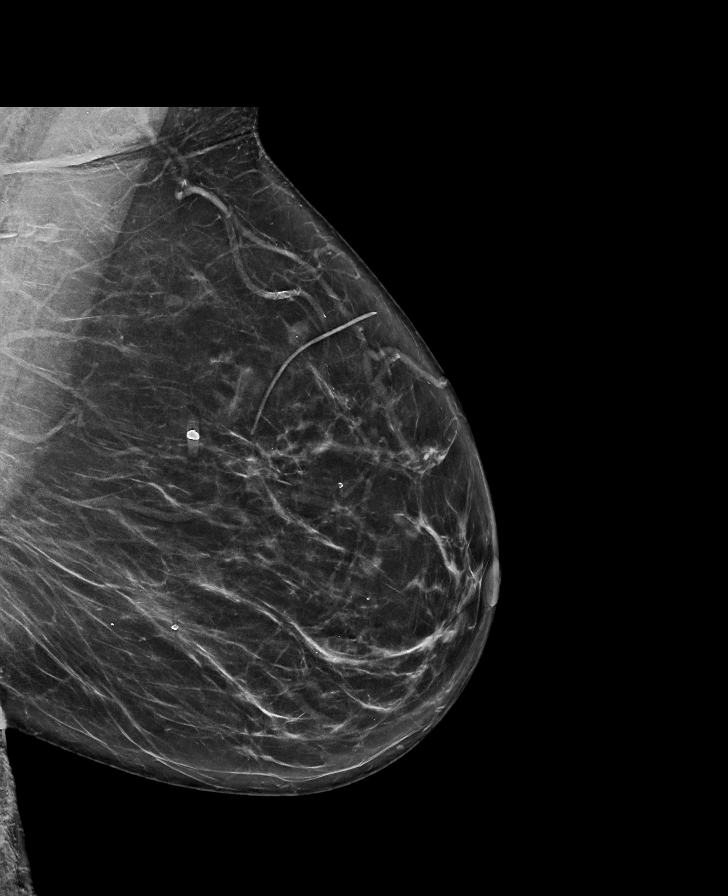

[L CC tomo · tomo slice 35/70.0]
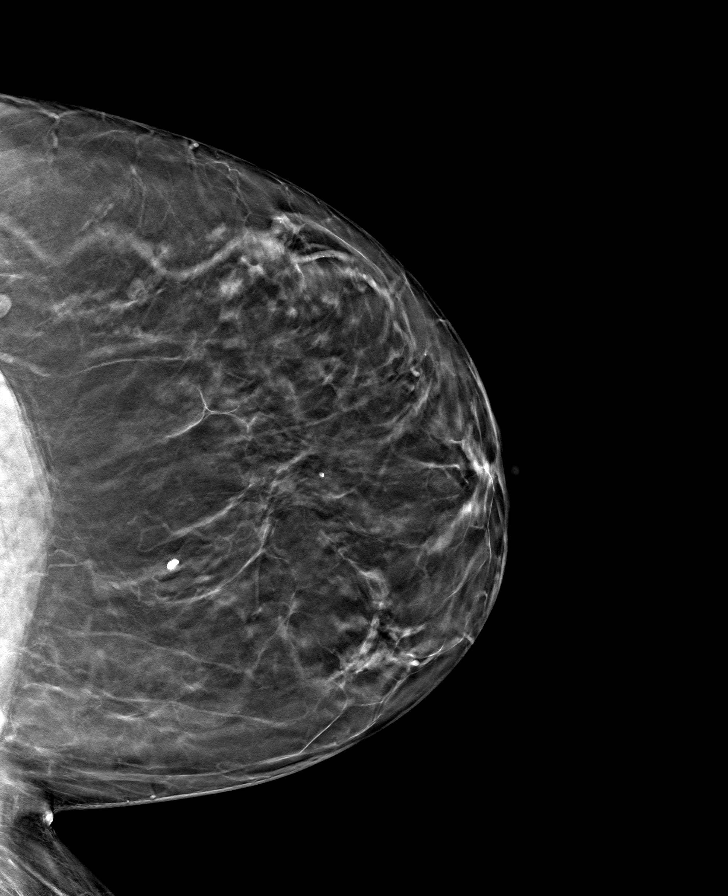

[L MLO tomo · tomo slice 40/79.0]
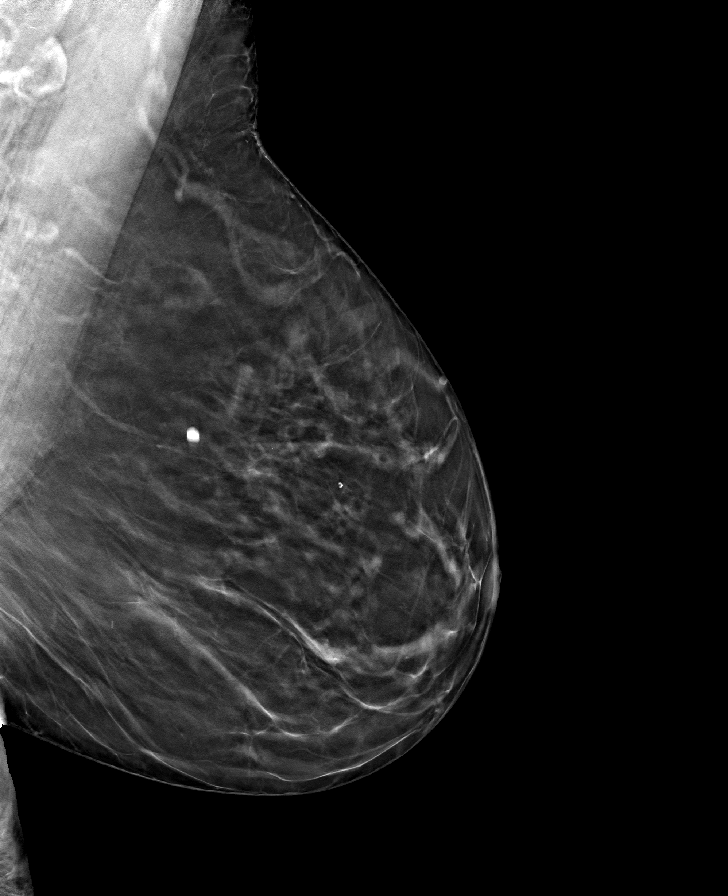

[R MLO tomo · tomo slice 39/78.0]
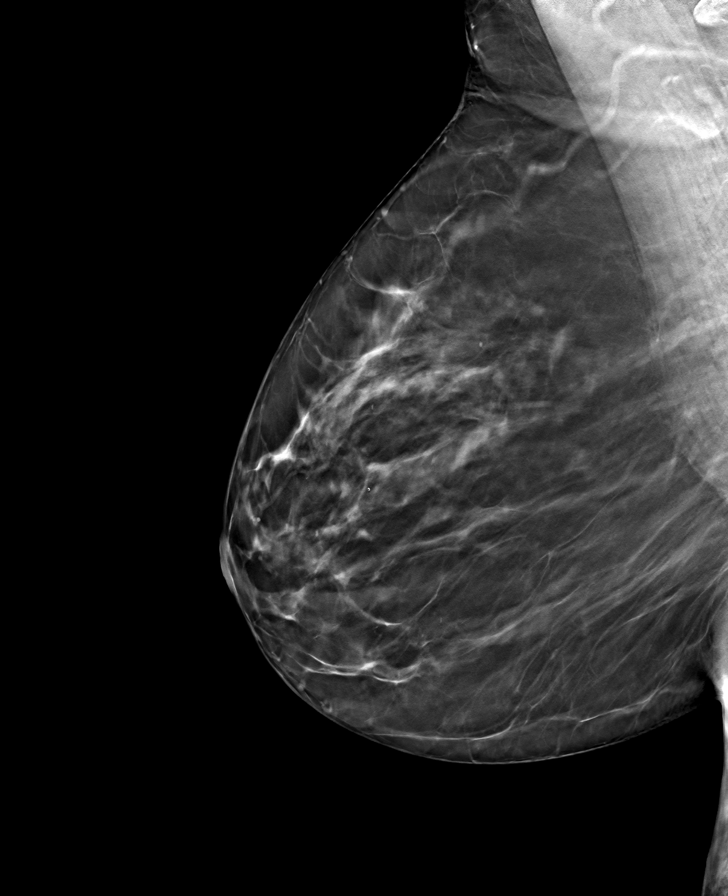

[R CC tomo · tomo slice 35/70.0]
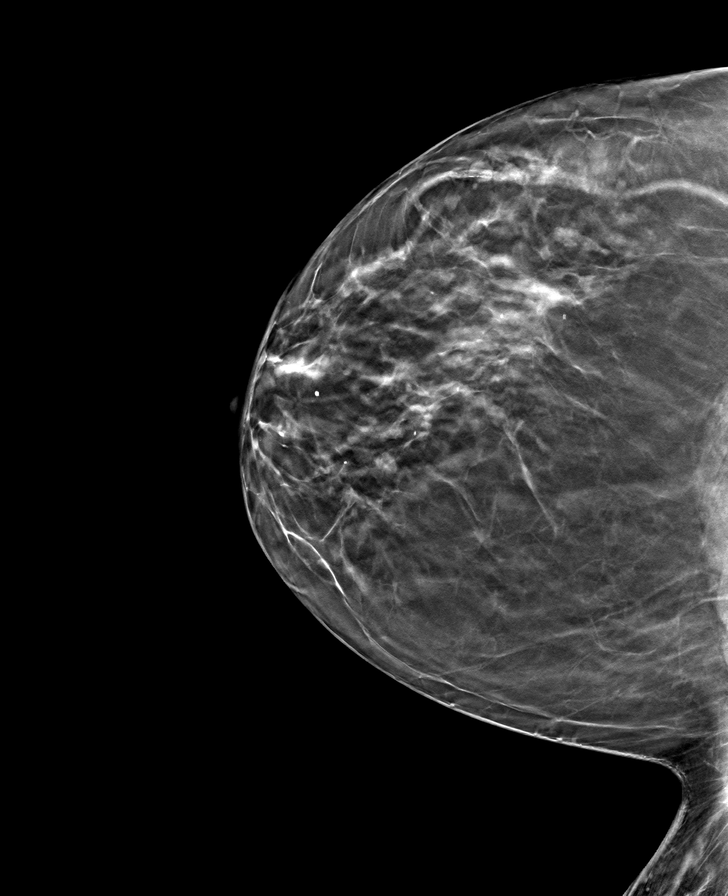

[8 of 24 positions shown; findings below may reference images not displayed]

ACR Breast Density Category b: There are scattered areas of
fibroglandular density.
FINDINGS: There are no findings suspicious for malignancy.
IMPRESSION: No mammographic evidence of malignancy. A result letter of this
screening mammogram will be mailed directly to the patient.

RECOMMENDATION:
Screening mammogram in one year. (Code:51-O-LD2)

BI-RADS CATEGORY  1: Negative.

## 2023-07-24 ENCOUNTER — Ambulatory Visit: Payer: Medicare Other | Admitting: Nurse Practitioner

## 2023-07-26 ENCOUNTER — Other Ambulatory Visit: Payer: Self-pay | Admitting: Family

## 2023-07-26 DIAGNOSIS — F419 Anxiety disorder, unspecified: Secondary | ICD-10-CM

## 2024-01-31 ENCOUNTER — Encounter: Payer: Medicare Other | Admitting: Nurse Practitioner
